# Patient Record
Sex: Female | Born: 1965 | Race: Black or African American | Hispanic: No | Marital: Single | Smoking: Current every day smoker
Health system: Southern US, Community
[De-identification: ages and names within clinical notes are randomized; demographics above are authoritative.]

## PROBLEM LIST (undated history)

## (undated) DIAGNOSIS — E119 Type 2 diabetes mellitus without complications: Secondary | ICD-10-CM

## (undated) DIAGNOSIS — I10 Essential (primary) hypertension: Secondary | ICD-10-CM

## (undated) HISTORY — DX: Type 2 diabetes mellitus without complications: E11.9

---

## 2017-10-25 ENCOUNTER — Other Ambulatory Visit: Payer: Self-pay | Admitting: Obstetrics and Gynecology

## 2017-10-25 ENCOUNTER — Other Ambulatory Visit (HOSPITAL_COMMUNITY)
Admission: RE | Admit: 2017-10-25 | Discharge: 2017-10-25 | Disposition: A | Discharge status: Home / Self Care | Payer: BLUE CROSS/BLUE SHIELD | Source: Ambulatory Visit | Attending: Obstetrics and Gynecology | Admitting: Obstetrics and Gynecology

## 2017-10-25 DIAGNOSIS — Z01411 Encounter for gynecological examination (general) (routine) with abnormal findings: Secondary | ICD-10-CM | POA: Diagnosis not present

## 2017-10-27 LAB — CYTOLOGY - PAP
Diagnosis: NEGATIVE
HPV (WINDOPATH): NOT DETECTED

## 2017-11-08 ENCOUNTER — Other Ambulatory Visit: Payer: Self-pay | Admitting: Internal Medicine

## 2017-11-08 DIAGNOSIS — Z1231 Encounter for screening mammogram for malignant neoplasm of breast: Secondary | ICD-10-CM

## 2017-11-27 ENCOUNTER — Encounter: Payer: Self-pay | Admitting: Radiology

## 2017-11-27 ENCOUNTER — Ambulatory Visit
Admission: RE | Admit: 2017-11-27 | Discharge: 2017-11-27 | Disposition: A | Discharge status: Home / Self Care | Payer: BLUE CROSS/BLUE SHIELD | Source: Ambulatory Visit | Attending: Internal Medicine | Admitting: Internal Medicine

## 2017-11-27 DIAGNOSIS — Z1231 Encounter for screening mammogram for malignant neoplasm of breast: Secondary | ICD-10-CM

## 2017-12-04 ENCOUNTER — Encounter: Payer: Self-pay | Admitting: Skilled Nursing Facility1

## 2017-12-04 ENCOUNTER — Encounter: Payer: BLUE CROSS/BLUE SHIELD | Attending: Internal Medicine | Admitting: Skilled Nursing Facility1

## 2017-12-04 DIAGNOSIS — Z713 Dietary counseling and surveillance: Secondary | ICD-10-CM | POA: Diagnosis present

## 2017-12-04 DIAGNOSIS — E119 Type 2 diabetes mellitus without complications: Secondary | ICD-10-CM

## 2017-12-04 NOTE — Patient Instructions (Addendum)
-  Always bring your meter with you everywhere you go -Always Properly dispose of your needles:  -Discard in a hard plastic/metal container with a lid (something the needle can't puncture)  -Write Do Not Recycle on the outside of the container  -Example: A laundry detergent bottle -Never use the same needle more than once -Eat 2-3 carbohydrate choices for each meal and 1 for each snack -A meal: carbohydrates, protein, vegetable -A snack: A Fruit OR Vegetable AND Protein  -Try to be more active -Always pay attention to your body keeping watchful of possible low blood sugar (below 70) or high blood sugar (above 200)  -Check your feet every day looking for anything that was not there the day before  -Log your numbers when checking your blood sugar  -Soy crumbles

## 2017-12-04 NOTE — Progress Notes (Signed)
Diabetes Self-Management Education  Visit Type: First/Initial  12/04/2017  [name], identified by name and date of birth, is a 52 y.o. female with a diagnosis of Diabetes: Type 2.   ASSESSMENT  There were no vitals taken for this visit. There is no height or weight on file to calculate BMI.  Pt did not know the name of her medications and states she is now taking them again after running out.  Pt is taking glipizide and metformin but pt states she is taking some kind of medicine that has metformin and something else in it.  Pt states she is checking her blood sugar randomly.  Pt states her blood sugar will drop low to 100 or 70 and she feels sweaty.  Pt states she wakes up in the middle of the night to snack usually when getting up to urinate. Pt states her hands have been numb for the last 2 weeks. Pt continued to shake her hands during the apportionment.  Pt states she plans to ride her bike for 40 minutes 6-7 days a week.   Diabetes Self-Management Education - 12/04/17 1515      Visit Information   Visit Type  First/Initial      Initial Visit   Diabetes Type  Type 2    Are you currently following a meal plan?  No    Are you taking your medications as prescribed?  Yes      Health Coping   How would you rate your overall health?  Fair      Psychosocial Assessment   Patient Belief/Attitude about Diabetes  Motivated to manage diabetes      Pre-Education Assessment   Patient understands the diabetes disease and treatment process.  Needs Instruction    Patient understands incorporating nutritional management into lifestyle.  Needs Instruction    Patient undertands incorporating physical activity into lifestyle.  Needs Instruction    Patient understands using medications safely.  Needs Instruction    Patient understands monitoring blood glucose, interpreting and using results  Needs Instruction    Patient understands prevention, detection, and treatment of acute  complications.  Needs Instruction    Patient understands prevention, detection, and treatment of chronic complications.  Needs Instruction    Patient understands how to develop strategies to address psychosocial issues.  Needs Instruction    Patient understands how to develop strategies to promote health/change behavior.  Needs Instruction      Complications   Last HgB A1C per patient/outside source  10 %    How often do you check your blood sugar?  1-2 times/day    Fasting Blood glucose range (mg/dL)  >130    Postprandial Blood glucose range (mg/dL)  >865    Have you had a dilated eye exam in the past 12 months?  No    Have you had a dental exam in the past 12 months?  No    Are you checking your feet?  Yes    How many days per week are you checking your feet?  7      Dietary Intake   Breakfast  skipped    Snack (morning)  desserts    Lunch  chicken and rice and snap peas    Snack (afternoon)  candy    Dinner  chicken and rice and snap peas    Snack (evening)  waking in the night to eat desserts    Beverage(s)  soda, juice, water, energy drinks, coffee, spirits  Exercise   Exercise Type  Light (walking / raking leaves)    How many days per week to you exercise?  1    How many minutes per day do you exercise?  40    Total minutes per week of exercise  40      Patient Education   Previous Diabetes Education  No    Disease state   Definition of diabetes, type 1 and 2, and the diagnosis of diabetes;Factors that contribute to the development of diabetes    Nutrition management   Food label reading, portion sizes and measuring food.;Effects of alcohol on blood glucose and safety factors with consumption of alcohol.    Physical activity and exercise   Role of exercise on diabetes management, blood pressure control and cardiac health.;Identified with patient nutritional and/or medication changes necessary with exercise.    Monitoring  Purpose and frequency of SMBG.;Yearly dilated eye  exam;Daily foot exams;Taught/evaluated SMBG meter.    Acute complications  Taught treatment of hypoglycemia - the 15 rule.;Discussed and identified patients' treatment of hyperglycemia.    Chronic complications  Assessed and discussed foot care and prevention of foot problems;Nephropathy, what it is, prevention of, the use of ACE, ARB's and early detection of through urine microalbumia.    Psychosocial adjustment  Role of stress on diabetes      Individualized Goals (developed by patient)   Nutrition  Follow meal plan discussed;Adjust meds/carbs with exercise as discussed;General guidelines for healthy choices and portions discussed    Physical Activity  Exercise 5-7 days per week;45 minutes per day      Post-Education Assessment   Patient understands the diabetes disease and treatment process.  Demonstrates understanding / competency    Patient understands incorporating nutritional management into lifestyle.  Demonstrates understanding / competency    Patient undertands incorporating physical activity into lifestyle.  Demonstrates understanding / competency    Patient understands using medications safely.  Demonstrates understanding / competency    Patient understands monitoring blood glucose, interpreting and using results  Demonstrates understanding / competency    Patient understands prevention, detection, and treatment of acute complications.  Demonstrates understanding / competency    Patient understands prevention, detection, and treatment of chronic complications.  Demonstrates understanding / competency    Patient understands how to develop strategies to address psychosocial issues.  Demonstrates understanding / competency    Patient understands how to develop strategies to promote health/change behavior.  Demonstrates understanding / competency      Outcomes   Expected Outcomes  Demonstrated interest in learning. Expect positive outcomes    Future DMSE  PRN    Program Status   Completed       Individualized Plan for Diabetes Self-Management Training:   Learning Objective:  Patient will have a greater understanding of diabetes self-management. Patient education plan is to attend individual and/or group sessions per assessed needs and concerns.   Plan:   Patient Instructions  -Always bring your meter with you everywhere you go -Always Properly dispose of your needles:  -Discard in a hard plastic/metal container with a lid (something the needle can't puncture)  -Write Do Not Recycle on the outside of the container  -Example: A laundry detergent bottle -Never use the same needle more than once -Eat 2-3 carbohydrate choices for each meal and 1 for each snack -A meal: carbohydrates, protein, vegetable -A snack: A Fruit OR Vegetable AND Protein  -Try to be more active -Always pay attention to your body keeping watchful of  possible low blood sugar (below 70) or high blood sugar (above 200)  -Check your feet every day looking for anything that was not there the day before  -Log your numbers when checking your blood sugar  -Soy crumbles   Expected Outcomes:  Demonstrated interest in learning. Expect positive outcomes  Education material provided: ADA Diabetes: Your Take Control Guide, My Plate and Snack sheet  If problems or questions, patient to contact team via:  Phone  Future DSME appointment: PRN

## 2018-07-03 ENCOUNTER — Emergency Department (HOSPITAL_COMMUNITY): Payer: Self-pay

## 2018-07-03 ENCOUNTER — Emergency Department (HOSPITAL_COMMUNITY)
Admission: EM | Admit: 2018-07-03 | Discharge: 2018-07-03 | Disposition: A | Discharge status: Home / Self Care | Payer: Self-pay | Attending: Emergency Medicine | Admitting: Emergency Medicine

## 2018-07-03 ENCOUNTER — Encounter (HOSPITAL_COMMUNITY): Payer: Self-pay

## 2018-07-03 ENCOUNTER — Other Ambulatory Visit: Payer: Self-pay

## 2018-07-03 DIAGNOSIS — Z5321 Procedure and treatment not carried out due to patient leaving prior to being seen by health care provider: Secondary | ICD-10-CM | POA: Insufficient documentation

## 2018-07-03 DIAGNOSIS — R200 Anesthesia of skin: Secondary | ICD-10-CM | POA: Insufficient documentation

## 2018-07-03 LAB — COMPREHENSIVE METABOLIC PANEL
ALBUMIN: 4.1 g/dL (ref 3.5–5.0)
ALT: 19 U/L (ref 0–44)
AST: 21 U/L (ref 15–41)
Alkaline Phosphatase: 78 U/L (ref 38–126)
Anion gap: 13 (ref 5–15)
BUN: 10 mg/dL (ref 6–20)
CO2: 24 mmol/L (ref 22–32)
Calcium: 9.9 mg/dL (ref 8.9–10.3)
Chloride: 99 mmol/L (ref 98–111)
Creatinine, Ser: 0.58 mg/dL (ref 0.44–1.00)
GFR calc Af Amer: >60 mL/min (ref >60)
GFR calc non Af Amer: >60 mL/min (ref >60)
Glucose, Bld: 355 mg/dL — ABNORMAL HIGH (ref 70–99)
Potassium: 3.6 mmol/L (ref 3.5–5.1)
Sodium: 136 mmol/L (ref 135–145)
Total Bilirubin: 1 mg/dL (ref 0.3–1.2)
Total Protein: 7.4 g/dL (ref 6.5–8.1)

## 2018-07-03 LAB — CBC
HCT: 40.4 % (ref 36.0–46.0)
Hemoglobin: 13.1 g/dL (ref 12.0–15.0)
MCH: 28.7 pg (ref 26.0–34.0)
MCHC: 32.4 g/dL (ref 30.0–36.0)
MCV: 88.6 fL (ref 80.0–100.0)
Platelets: 270 10*3/uL (ref 150–400)
RBC: 4.56 MIL/uL (ref 3.87–5.11)
RDW: 12.4 % (ref 11.5–15.5)
WBC: 5.2 10*3/uL (ref 4.0–10.5)
nRBC: 0 % (ref 0.0–0.2)

## 2018-07-03 LAB — APTT: aPTT: 29 seconds (ref 24–36)

## 2018-07-03 LAB — I-STAT TROPONIN, ED: Troponin i, poc: 0.01 ng/mL (ref 0.00–0.08)

## 2018-07-03 LAB — CBG MONITORING, ED: Glucose-Capillary: 326 mg/dL — ABNORMAL HIGH (ref 70–99)

## 2018-07-03 LAB — DIFFERENTIAL
Abs Immature Granulocytes: 0.01 10*3/uL (ref 0.00–0.07)
Basophils Absolute: 0 10*3/uL (ref 0.0–0.1)
Basophils Relative: 1 %
Eosinophils Absolute: 0.1 10*3/uL (ref 0.0–0.5)
Eosinophils Relative: 2 %
IMMATURE GRANULOCYTES: 0 %
LYMPHS ABS: 1.8 10*3/uL (ref 0.7–4.0)
Lymphocytes Relative: 35 %
Monocytes Absolute: 0.3 10*3/uL (ref 0.1–1.0)
Monocytes Relative: 6 %
Neutro Abs: 2.9 10*3/uL (ref 1.7–7.7)
Neutrophils Relative %: 56 %

## 2018-07-03 LAB — I-STAT BETA HCG BLOOD, ED (MC, WL, AP ONLY): I-stat hCG, quantitative: <5 m[IU]/mL (ref <5)

## 2018-07-03 LAB — PROTIME-INR
INR: 1.16
Prothrombin Time: 14.7 seconds (ref 11.4–15.2)

## 2018-07-03 NOTE — ED Notes (Signed)
Ambulated with steady gait. NO assistance was needed.

## 2018-07-03 NOTE — ED Notes (Signed)
Patient left [facility] ED AMA, stating she does not have to wait, she is getting evicted from home. Given information about [employer] and Goodrx card. Patient was pleasant in demeanor but strongly assured that she could not stay.

## 2018-07-03 NOTE — ED Triage Notes (Signed)
Pt here with complaints of being off balance and numbness to bilateral hands.  Symptoms for over a week.  Hx of HTN with non compliance with medication.  Remember to take medications today but has not for over a week.  States she trips over her feet and also developing abdominal pain while sitting in triage.

## 2018-07-03 NOTE — ED Triage Notes (Addendum)
On exam left hand is weaker than right with left foot feeling heavy for patient. Family member states patient's speech was slurred earlier last week, speech clear in triage.

## 2018-07-13 ENCOUNTER — Encounter: Payer: Self-pay | Admitting: Neurology

## 2018-07-19 ENCOUNTER — Ambulatory Visit: Payer: Self-pay | Attending: Family Medicine | Admitting: Physician Assistant

## 2018-07-19 ENCOUNTER — Telehealth: Payer: Self-pay | Admitting: Family Medicine

## 2018-07-19 VITALS — BP 175/90 | HR 75 | Temp 98.7°F | Ht 60.0 in | Wt 165.6 lb

## 2018-07-19 DIAGNOSIS — E1165 Type 2 diabetes mellitus with hyperglycemia: Secondary | ICD-10-CM

## 2018-07-19 DIAGNOSIS — Z09 Encounter for follow-up examination after completed treatment for conditions other than malignant neoplasm: Secondary | ICD-10-CM

## 2018-07-19 DIAGNOSIS — I10 Essential (primary) hypertension: Secondary | ICD-10-CM

## 2018-07-19 LAB — POCT GLYCOSYLATED HEMOGLOBIN (HGB A1C): Hemoglobin A1C: 11.5 % — AB (ref 4.0–5.6)

## 2018-07-19 LAB — GLUCOSE, POCT (MANUAL RESULT ENTRY): POC GLUCOSE: 269 mg/dL — AB (ref 70–99)

## 2018-07-19 MED ORDER — LISINOPRIL-HYDROCHLOROTHIAZIDE 10-12.5 MG PO TABS: 2.0000 | ORAL_TABLET | Freq: Every day | ORAL | 3 refills | Status: DC

## 2018-07-19 MED ORDER — RELION LANCING DEVICE KIT: 500.0000 mg | PACK | Freq: Every day | 0 refills | Status: DC

## 2018-07-19 MED ORDER — METFORMIN HCL 500 MG PO TABS: 1000.0000 mg | ORAL_TABLET | Freq: Two times a day (BID) | ORAL | 3 refills | Status: DC

## 2018-07-19 MED ORDER — TRUE METRIX METER DEVI: 1.0000 | Freq: Three times a day (TID) | 0 refills | Status: DC

## 2018-07-19 MED ORDER — GLUCOSE BLOOD VI STRP: ORAL_STRIP | 12 refills | Status: DC

## 2018-07-19 MED ORDER — RELION PRIME MONITOR DEVI: 0 refills | Status: DC

## 2018-07-19 MED ORDER — GLIPIZIDE 10 MG PO TABS: 10.0000 mg | ORAL_TABLET | Freq: Every day | ORAL | 3 refills | Status: DC

## 2018-07-19 MED ORDER — GLUCOSE BLOOD VI STRP: 1.0000 | ORAL_STRIP | Freq: Three times a day (TID) | 12 refills | Status: DC

## 2018-07-19 MED ORDER — TRUEPLUS LANCETS 28G MISC: 1.0000 | Freq: Three times a day (TID) | 11 refills | Status: DC

## 2018-07-19 MED FILL — metFORMIN HCL 500 MG TABS: 500 | 30 days supply | Qty: 120 | Fill #0

## 2018-07-19 MED FILL — !TRUE METRIX BLOOD GLUCOSE: 365 days supply | Qty: 1 | Fill #0

## 2018-07-19 MED FILL — TRUEplus LANCETS 28G MISC: 30 days supply | Qty: 100 | Fill #0

## 2018-07-19 MED FILL — LISINOPRIL-HCTZ 10-12.5 MG: 10-12.5 | 30 days supply | Qty: 60 | Fill #0

## 2018-07-19 MED FILL — glipiZIDE 10 MG TABS: 10 | 30 days supply | Qty: 30 | Fill #0

## 2018-07-19 MED FILL — TRUE METRIX TEST STRIP: 30 days supply | Qty: 100 | Fill #0

## 2018-07-19 NOTE — Patient Instructions (Signed)
Check blood sugars fasting and at bedtime and record and bring to next visit.  Check blood pressure 3 times weekly and record and bring to next visit

## 2018-07-19 NOTE — Telephone Encounter (Signed)
[facility] [address] ([address]), [place] - [address] [phone] (Phone)    Pharmacy called to inquire if [facility] wanted to change pt to plain Metformin as written. Pharmacist states pt typically get the extended release.

## 2018-07-19 NOTE — Progress Notes (Signed)
Patient ID: Cassandra Gordon, female   DOB: June 04, 1966, 53 y.o.   MRN: [id number]      Cassandra Gordon, is a 53 y.o. female  [id number]:[id number]  [id number]  DOB - June 19, 1966  Subjective:  Chief Complaint and HPI: Cassandra Gordon is a 53 y.o. female here today to establish care and for a follow up visit. Went to ED 07/03/2018 and left without being seen but not before labs could be done and blood sugar was 326.  Other labs negative.  She has been diabetic for about 30 years by history.  She says she has been out of meds for about 7 months.  She has not been checkin her blood sugars.  The hospital did call in some medications to get her restarted on them.  She is c/o paresthesias in B feet/hands.  Needs new meter.  Not checking sugars.    ED/Hospital notes reviewed-summary above.    ROS:   Constitutional:  No f/c, No night sweats, No unexplained weight loss. EENT:  No vision changes, No blurry vision, No hearing changes. No mouth, throat, or ear problems.  Respiratory: No cough, No SOB Cardiac: No CP, no palpitations GI:  No abd pain, No N/V/D. GU: No Urinary s/sx Musculoskeletal: No joint pain Neuro: No headache, no dizziness, no motor weakness.  Skin: No rash Endocrine:  some polydipsia. some polyuria.  Psych: Denies SI/HI  No problems updated.  ALLERGIES: No Known Allergies  PAST MEDICAL HISTORY: Past Medical History:  Diagnosis Date  . Diabetes mellitus without complication ([place])     MEDICATIONS AT HOME: Prior to Admission medications   Medication Sig Start Date End Date Taking? Authorizing Provider  glipiZIDE (GLUCOTROL) 10 MG tablet Take 1 tablet (10 mg total) by mouth daily before breakfast. 07/19/18  Yes McClung, Angela M, PA-C  lisinopril-hydrochlorothiazide (PRINZIDE,ZESTORETIC) 10-12.5 MG tablet Take 2 tablets by mouth daily. 07/19/18  Yes McClung, Angela M, PA-C  metFORMIN (GLUCOPHAGE) 500 MG tablet Take 2 tablets (1,000 mg total) by mouth 2 (two) times daily with a  meal. Take 1 tablet by mouth twice daily with a meal for 30 days. 07/19/18  Yes Freeman Caldron M, PA-C  Blood Glucose Monitoring Suppl (RELION PRIME MONITOR) DEVI [place] as directed 07/19/18   Argentina Donovan, PA-C  Blood Glucose Monitoring Suppl (TRUE METRIX METER) DEVI 1 each by Does not apply route 3 (three) times daily. 07/19/18   Argentina Donovan, PA-C  glucose blood (RELION PRIME TEST) test strip Use as instructed 07/19/18   Argentina Donovan, PA-C  glucose blood (TRUE METRIX BLOOD GLUCOSE TEST) test strip 1 each by Other route 3 (three) times daily. 07/19/18   Argentina Donovan, PA-C  Lancets Misc. (RELION LANCING DEVICE) KIT 500 mg by Does not apply route daily. 07/19/18   Argentina Donovan, PA-C  TRUEPLUS LANCETS 28G MISC 1 each by Does not apply route 3 (three) times daily. 07/19/18   Argentina Donovan, PA-C     Objective:  EXAM:   Vitals:   07/19/18 1128  BP: (!) 175/90  Pulse: 75  Temp: 98.7 F (37.1 C)  TempSrc: Oral  SpO2: 98%  Weight: 165 lb 9.6 oz (75.1 kg)  Height: 5' (1.524 m)    General appearance : A&OX3. NAD. Non-toxic-appearing HEENT: Atraumatic and Normocephalic.  PERRLA. EOM intact.   Chest/Lungs:  Breathing-non-labored, Good air entry bilaterally, breath sounds normal without rales, rhonchi, or wheezing  CVS: S1 S2 regular, no murmurs, gallops, rubs  Extremities: Bilateral Lower Ext  shows no edema, both legs are warm to touch with = pulse throughout Neurology:  CN II-XII grossly intact, Non focal.   Psych:  TP linear in general but sometimes scattered. J/I fair. Normal speech. Appropriate eye contact and affect.  Skin:  No Rash  Data Review Lab Results  Component Value Date   HGBA1C 11.5 (A) 07/19/2018     Assessment & Plan   1. Type 2 diabetes mellitus with hyperglycemia, unspecified whether long term insulin use (HCC) Uncontrolled-will go ahead and increase dose of metfromin from '500mg'$  bid to '1000mg'$  bid - Glucose (CBG) - HgB A1c - metFORMIN  (GLUCOPHAGE) 500 MG tablet; Take 2 tablets (1,000 mg total) by mouth 2 (two) times daily with a meal. Take 1 tablet by mouth twice daily with a meal for 30 days.  Dispense: 120 tablet; Refill: 3 - glipiZIDE (GLUCOTROL) 10 MG tablet; Take 1 tablet (10 mg total) by mouth daily before breakfast.  Dispense: 30 tablet; Refill: 3 -new meter and supplies ordered.test glucose fasting and bedtime and bring to next visit.  2. Hypertension, unspecified type Uncontrolled-increase dose to - lisinopril-hydrochlorothiazide (PRINZIDE,ZESTORETIC) 10-12.5 MG tablet; Take 2 tablets by mouth daily.  Dispense: 30 tablet; Refill: 3 -check bp 3-5 times weekly and record and bring to next visit.  Importance of compliance stressed.  3. Encounter for examination following treatment at hospital Improving-not at any goals  Patient have been counseled extensively about nutrition and exercise  Return in about 3 weeks (around 08/09/2018) for assign PCP-f/up DM.  The patient was given clear instructions to go to ER or return to medical center if symptoms don't improve, worsen or new problems develop. The patient verbalized understanding. The patient was told to call to get lab results if they haven't heard anything in the next week.     Freeman Caldron, PA-C [facility], [place]   07/19/2018, 1:25 PM

## 2018-07-26 ENCOUNTER — Ambulatory Visit: Payer: Self-pay | Admitting: Family Medicine

## 2018-08-06 ENCOUNTER — Other Ambulatory Visit: Payer: Self-pay | Admitting: Pharmacist

## 2018-08-06 DIAGNOSIS — E1165 Type 2 diabetes mellitus with hyperglycemia: Secondary | ICD-10-CM

## 2018-08-06 MED ORDER — METFORMIN HCL 500 MG PO TABS: 1000.0000 mg | ORAL_TABLET | Freq: Two times a day (BID) | ORAL | 2 refills | Status: DC

## 2018-09-07 ENCOUNTER — Other Ambulatory Visit: Payer: Self-pay

## 2018-09-07 ENCOUNTER — Encounter (HOSPITAL_COMMUNITY): Payer: Self-pay

## 2018-09-07 ENCOUNTER — Emergency Department (HOSPITAL_COMMUNITY)
Admission: EM | Admit: 2018-09-07 | Discharge: 2018-09-07 | Disposition: A | Discharge status: Home / Self Care | Payer: Self-pay | Attending: Emergency Medicine | Admitting: Emergency Medicine

## 2018-09-07 DIAGNOSIS — E119 Type 2 diabetes mellitus without complications: Secondary | ICD-10-CM | POA: Insufficient documentation

## 2018-09-07 DIAGNOSIS — Z79899 Other long term (current) drug therapy: Secondary | ICD-10-CM | POA: Insufficient documentation

## 2018-09-07 DIAGNOSIS — I10 Essential (primary) hypertension: Secondary | ICD-10-CM | POA: Insufficient documentation

## 2018-09-07 DIAGNOSIS — F17210 Nicotine dependence, cigarettes, uncomplicated: Secondary | ICD-10-CM | POA: Insufficient documentation

## 2018-09-07 DIAGNOSIS — H1089 Other conjunctivitis: Secondary | ICD-10-CM | POA: Insufficient documentation

## 2018-09-07 DIAGNOSIS — H109 Unspecified conjunctivitis: Secondary | ICD-10-CM

## 2018-09-07 DIAGNOSIS — J302 Other seasonal allergic rhinitis: Secondary | ICD-10-CM

## 2018-09-07 HISTORY — DX: Essential (primary) hypertension: I10

## 2018-09-07 MED ORDER — LORATADINE 10 MG PO TABS: 10.0000 mg | ORAL_TABLET | Freq: Every day | ORAL | 0 refills | Status: DC

## 2018-09-07 MED ORDER — TETRACAINE HCL 0.5 % OP SOLN
2.0000 [drp] | Freq: Once | OPHTHALMIC | Status: AC
  Administered 2018-09-07: 2 [drp] via OPHTHALMIC
  Filled 2018-09-07: qty 4

## 2018-09-07 MED ORDER — FLUORESCEIN SODIUM 1 MG OP STRP
1.0000 | ORAL_STRIP | Freq: Once | OPHTHALMIC | Status: AC
  Administered 2018-09-07: 1 via OPHTHALMIC
  Filled 2018-09-07: qty 1

## 2018-09-07 MED ORDER — FLUTICASONE PROPIONATE 50 MCG/ACT NA SUSP: 2.0000 | Freq: Every day | NASAL | 0 refills | Status: DC

## 2018-09-07 MED ORDER — ERYTHROMYCIN 5 MG/GM OP OINT
1.0000 "application " | TOPICAL_OINTMENT | Freq: Four times a day (QID) | OPHTHALMIC | Status: DC
  Administered 2018-09-07: 1 via OPHTHALMIC
  Filled 2018-09-07: qty 3.5

## 2018-09-07 NOTE — ED Provider Notes (Signed)
[facility] EMERGENCY DEPARTMENT Provider Note   CSN: [id number] Arrival date & time: 09/07/18  0911    History   Chief Complaint Chief Complaint  Patient presents with  . Eye Problem  . Cough    HPI Cassandra Gordon is a 53 y.o. female.     HPI  Pt is a 53 y/o female with a h/o T2DM, HTN, who presents to the ED today for evaluation of bilat eye redness that began 4 days ago. She reports associated, bilat eye irritation, itchiness, and drainage. States initially she only had drainage of pus in the morning, however she has now had recurrent drainage of pus throughout the day. Does not wear contacts. She has some blurred vision when pus is present however when she clears pus away her vision improves. Reports associated rhinorrhea, congestion, post nasal drip, sneezing, scratchy/sore throat,   She also reports cough, and she attributes to her postnasal drip. No cp or sob. No fevers.  States that her partner also had a cold that has resolved. No recent foreign travel or travel to areas with high rates of coronavirus. No known exposures to persons with coronavirus.  Past Medical History:  Diagnosis Date  . Diabetes mellitus without complication ([place])   . Hypertension     Patient Active Problem List   Diagnosis Date Noted  . Type 2 diabetes mellitus with hyperglycemia ([place]) 07/19/2018  . Hypertension 07/19/2018    History reviewed. No pertinent surgical history.   OB History   No obstetric history on file.      Home Medications    Prior to Admission medications   Medication Sig Start Date End Date Taking? Authorizing Provider  Blood Glucose Monitoring Suppl (RELION PRIME MONITOR) DEVI [place] as directed 07/19/18   Argentina Donovan, PA-C  Blood Glucose Monitoring Suppl (TRUE METRIX METER) DEVI 1 each by Does not apply route 3 (three) times daily. 07/19/18   Argentina Donovan, PA-C  fluticasone (FLONASE) 50 MCG/ACT nasal spray Place 2 sprays into both  nostrils daily. 09/07/18   Anouk Critzer S, PA-C  glipiZIDE (GLUCOTROL) 10 MG tablet Take 1 tablet (10 mg total) by mouth daily before breakfast. 07/19/18   Argentina Donovan, PA-C  glucose blood (RELION PRIME TEST) test strip Use as instructed 07/19/18   Argentina Donovan, PA-C  glucose blood (TRUE METRIX BLOOD GLUCOSE TEST) test strip 1 each by Other route 3 (three) times daily. 07/19/18   Argentina Donovan, PA-C  Lancets Misc. (RELION LANCING DEVICE) KIT 500 mg by Does not apply route daily. 07/19/18   Argentina Donovan, PA-C  lisinopril-hydrochlorothiazide (PRINZIDE,ZESTORETIC) 10-12.5 MG tablet Take 2 tablets by mouth daily. 07/19/18   Argentina Donovan, PA-C  loratadine (CLARITIN) 10 MG tablet Take 1 tablet (10 mg total) by mouth daily for 14 days. 09/07/18 09/21/18  Joory Gough S, PA-C  metFORMIN (GLUCOPHAGE) 500 MG tablet Take 2 tablets (1,000 mg total) by mouth 2 (two) times daily with a meal. 08/06/18   Charlott Rakes, MD  TRUEPLUS LANCETS 28G MISC 1 each by Does not apply route 3 (three) times daily. 07/19/18   Argentina Donovan, PA-C    Family History Family History  Problem Relation Age of Onset  . Cancer Other   . Asthma Other     Social History Social History   Tobacco Use  . Smoking status: Current Every Day Smoker  . Smokeless tobacco: Never Used  Substance Use Topics  . Alcohol use: Yes  .  Drug use: Never     Allergies   Patient has no known allergies.   Review of Systems Review of Systems  Constitutional: Negative for chills and fever.  HENT: Positive for congestion, postnasal drip, rhinorrhea and sore throat. Negative for ear pain.   Eyes: Positive for discharge and itching. Negative for pain and visual disturbance.  Respiratory: Positive for cough. Negative for shortness of breath and wheezing.   Cardiovascular: Negative for chest pain.  Gastrointestinal: Negative for abdominal pain and vomiting.  Genitourinary: Negative for dysuria and hematuria.   Musculoskeletal: Negative for back pain.  Skin: Negative for rash.  Neurological: Negative for headaches.  All other systems reviewed and are negative.    Physical Exam Updated Vital Signs BP (!) 169/87   Pulse 93   Temp 98.9 F (37.2 C) (Oral)   Resp 16   SpO2 100%   Physical Exam Vitals signs and nursing note reviewed.  Constitutional:      General: She is not in acute distress.    Appearance: She is well-developed.  HENT:     Head: Normocephalic and atraumatic.     Nose: Congestion present.     Mouth/Throat:     Mouth: Mucous membranes are moist.     Pharynx: No oropharyngeal exudate or posterior oropharyngeal erythema.     Comments: No tonsillar edema or exudates. Does have several tonsilloliths.  Eyes:     General:        Right eye: Discharge present.        Left eye: Discharge present.    Pupils: Pupils are equal, round, and reactive to light.     Comments: Conjunctiva are erythematous bilat. No uptake with fluorescein staining. Tonometry: R: 20, L 21.  Neck:     Musculoskeletal: Neck supple.  Cardiovascular:     Rate and Rhythm: Normal rate and regular rhythm.     Heart sounds: Normal heart sounds. No murmur.  Pulmonary:     Effort: Pulmonary effort is normal. No respiratory distress.     Breath sounds: Normal breath sounds. No stridor. No wheezing or rhonchi.  Abdominal:     Palpations: Abdomen is soft.     Tenderness: There is no abdominal tenderness.  Skin:    General: Skin is warm and dry.  Neurological:     Mental Status: She is alert.      ED Treatments / Results  Labs (all labs ordered are listed, but only abnormal results are displayed) Labs Reviewed - No data to display  EKG None  Radiology No results found.  Procedures Procedures (including critical care time)  Medications Ordered in ED Medications  erythromycin ophthalmic ointment 1 application (1 application Both Eyes Given 09/07/18 1003)  tetracaine (PONTOCAINE) 0.5 %  ophthalmic solution 2 drop (2 drops Both Eyes Given by Other 09/07/18 4503)  fluorescein ophthalmic strip 1 strip (1 strip Both Eyes Given 09/07/18 0938)     Initial Impression / Assessment and Plan / ED Course  I have reviewed the triage vital signs and the nursing notes.  Pertinent labs & imaging results that were available during my care of the patient were reviewed by me and considered in my medical decision making (see chart for details).    Final Clinical Impressions(s) / ED Diagnoses   Final diagnoses:  Bacterial conjunctivitis  Seasonal allergies    Patient presenting with rhinorrhea, congestion, sneezing, itchy throat, and eye irritation/drainage. Vital signs are stable and patient is afebrile. Nontoxic and nonseptic appearing.  Physical exam  was within normal limits.  HEENT exam is grossly normal without evidence of bacterial infection. Lungs CTA.  Doubt PNa,  influenza or covid 19 without fevers or significant lower respiratory sxs. Sxs consistent with seasonal allergies. Given persistent eye sxs and new development of purulent drainage to bilat eyes that recurs throughout the day, will give rx for erythromycin. No uptake with fluoroscein stain. Pressures grossly wnl. Does not wear contacts.   Will give symptomatic treatment with allergy medications.  Advised that if sxs resolve with claritin and other allergy medications she is likely safe to return to work. She should refrain from going to work if she has continued cough or develops fevers until 48 hours after sxs resolve. Advise follow-up with PCP in 1 week and discussed reasons to return immediately to the ED.  All questions answered and patient understands the plan and reasons to return.  ED Discharge Orders         Ordered    loratadine (CLARITIN) 10 MG tablet  Daily     09/07/18 0957    fluticasone (FLONASE) 50 MCG/ACT nasal spray  Daily     09/07/18 0957           Rodney Booze, PA-C 09/07/18 1006    Noemi Chapel, MD 09/08/18 [phone]

## 2018-09-07 NOTE — ED Triage Notes (Addendum)
Pt reports bilateral eye redness, itchiness and drainage for the past 2-3 days. Pt states when she wakes up her eyes are sealed shut from drainage. Blurred vision since the symptoms started, pt wears glasses. Pt also reports productive cough. Denies fevers at home. Denies recent travel or sick contacts

## 2018-09-07 NOTE — ED Notes (Signed)
ED Provider at bedside. 

## 2018-09-07 NOTE — Discharge Instructions (Addendum)
Apply the antibiotic ointment to your eyes 4 times daily for the next 7 days.   Please use claritin and flonase as directed.   If your symptoms completely resolve with these medications then you are likely safe to be around people. If your symptoms persist, if you develop fevers, or a worsening cough then you should quarantine yourself until you do not have symptoms for 48 hours without medication.   Please follow up with your primary care provider within 5-7 days for re-evaluation of your symptoms. If you do not have a primary care provider, information for a healthcare clinic has been provided for you to make arrangements for follow up care. Please return to the emergency department for any new or worsening symptoms.

## 2018-09-15 ENCOUNTER — Emergency Department (HOSPITAL_COMMUNITY)
Admission: EM | Admit: 2018-09-15 | Discharge: 2018-09-15 | Disposition: A | Discharge status: Home / Self Care | Payer: Self-pay | Attending: Emergency Medicine | Admitting: Emergency Medicine

## 2018-09-15 ENCOUNTER — Emergency Department (HOSPITAL_COMMUNITY): Payer: Self-pay

## 2018-09-15 ENCOUNTER — Other Ambulatory Visit: Payer: Self-pay

## 2018-09-15 DIAGNOSIS — Z79899 Other long term (current) drug therapy: Secondary | ICD-10-CM | POA: Insufficient documentation

## 2018-09-15 DIAGNOSIS — I10 Essential (primary) hypertension: Secondary | ICD-10-CM

## 2018-09-15 DIAGNOSIS — Z7984 Long term (current) use of oral hypoglycemic drugs: Secondary | ICD-10-CM | POA: Insufficient documentation

## 2018-09-15 DIAGNOSIS — H538 Other visual disturbances: Secondary | ICD-10-CM | POA: Insufficient documentation

## 2018-09-15 DIAGNOSIS — H5509 Other forms of nystagmus: Secondary | ICD-10-CM | POA: Insufficient documentation

## 2018-09-15 DIAGNOSIS — E1165 Type 2 diabetes mellitus with hyperglycemia: Secondary | ICD-10-CM | POA: Insufficient documentation

## 2018-09-15 DIAGNOSIS — R42 Dizziness and giddiness: Secondary | ICD-10-CM

## 2018-09-15 DIAGNOSIS — R739 Hyperglycemia, unspecified: Secondary | ICD-10-CM

## 2018-09-15 DIAGNOSIS — R51 Headache: Secondary | ICD-10-CM | POA: Insufficient documentation

## 2018-09-15 DIAGNOSIS — F17200 Nicotine dependence, unspecified, uncomplicated: Secondary | ICD-10-CM | POA: Insufficient documentation

## 2018-09-15 LAB — CBC
HCT: 37.9 % (ref 36.0–46.0)
Hemoglobin: 12.3 g/dL (ref 12.0–15.0)
MCH: 29.1 pg (ref 26.0–34.0)
MCHC: 32.5 g/dL (ref 30.0–36.0)
MCV: 89.8 fL (ref 80.0–100.0)
Platelets: 316 10*3/uL (ref 150–400)
RBC: 4.22 MIL/uL (ref 3.87–5.11)
RDW: 11.9 % (ref 11.5–15.5)
WBC: 5.9 10*3/uL (ref 4.0–10.5)
nRBC: 0 % (ref 0.0–0.2)

## 2018-09-15 LAB — URINALYSIS, ROUTINE W REFLEX MICROSCOPIC
Bilirubin Urine: NEGATIVE
Glucose, UA: >=500 mg/dL — AB
Hgb urine dipstick: NEGATIVE
Ketones, ur: NEGATIVE mg/dL
Leukocytes,Ua: NEGATIVE
NITRITE: NEGATIVE
Protein, ur: NEGATIVE mg/dL
Specific Gravity, Urine: 1.026 (ref 1.005–1.030)
pH: 5 (ref 5.0–8.0)

## 2018-09-15 LAB — PROTIME-INR
INR: 1.1 (ref 0.8–1.2)
Prothrombin Time: 14.4 seconds (ref 11.4–15.2)

## 2018-09-15 LAB — COMPREHENSIVE METABOLIC PANEL
ALK PHOS: 96 U/L (ref 38–126)
ALT: 20 U/L (ref 0–44)
AST: 21 U/L (ref 15–41)
Albumin: 3.9 g/dL (ref 3.5–5.0)
Anion gap: 11 (ref 5–15)
BILIRUBIN TOTAL: 0.7 mg/dL (ref 0.3–1.2)
BUN: 10 mg/dL (ref 6–20)
CO2: 22 mmol/L (ref 22–32)
CREATININE: 0.66 mg/dL (ref 0.44–1.00)
Calcium: 9.8 mg/dL (ref 8.9–10.3)
Chloride: 98 mmol/L (ref 98–111)
GFR calc Af Amer: >60 mL/min (ref >60)
GFR calc non Af Amer: >60 mL/min (ref >60)
Glucose, Bld: 269 mg/dL — ABNORMAL HIGH (ref 70–99)
Potassium: 4.1 mmol/L (ref 3.5–5.1)
Sodium: 131 mmol/L — ABNORMAL LOW (ref 135–145)
TOTAL PROTEIN: 7.8 g/dL (ref 6.5–8.1)

## 2018-09-15 LAB — TROPONIN I: Troponin I: <0.03 ng/mL (ref <0.03)

## 2018-09-15 LAB — I-STAT BETA HCG BLOOD, ED (MC, WL, AP ONLY): I-stat hCG, quantitative: <5 m[IU]/mL (ref <5)

## 2018-09-15 LAB — RAPID URINE DRUG SCREEN, HOSP PERFORMED
Amphetamines: NOT DETECTED
Barbiturates: NOT DETECTED
Benzodiazepines: NOT DETECTED
Cocaine: NOT DETECTED
Opiates: NOT DETECTED
Tetrahydrocannabinol: NOT DETECTED

## 2018-09-15 LAB — DIFFERENTIAL
Abs Immature Granulocytes: 0.03 10*3/uL (ref 0.00–0.07)
Basophils Absolute: 0 10*3/uL (ref 0.0–0.1)
Basophils Relative: 1 %
Eosinophils Absolute: 0.3 10*3/uL (ref 0.0–0.5)
Eosinophils Relative: 4 %
IMMATURE GRANULOCYTES: 1 %
Lymphocytes Relative: 23 %
Lymphs Abs: 1.4 10*3/uL (ref 0.7–4.0)
Monocytes Absolute: 0.3 10*3/uL (ref 0.1–1.0)
Monocytes Relative: 5 %
Neutro Abs: 3.9 10*3/uL (ref 1.7–7.7)
Neutrophils Relative %: 66 %

## 2018-09-15 LAB — CBG MONITORING, ED: Glucose-Capillary: 257 mg/dL — ABNORMAL HIGH (ref 70–99)

## 2018-09-15 LAB — APTT: aPTT: 31 seconds (ref 24–36)

## 2018-09-15 MED ORDER — DIAZEPAM 5 MG PO TABS
5.0000 mg | ORAL_TABLET | Freq: Once | ORAL | Status: AC
  Administered 2018-09-15: 5 mg via ORAL
  Filled 2018-09-15: qty 1

## 2018-09-15 MED ORDER — LISINOPRIL-HYDROCHLOROTHIAZIDE 20-25 MG PO TABS: 2.0000 | ORAL_TABLET | Freq: Every day | ORAL | 1 refills | Status: DC

## 2018-09-15 MED ORDER — DIAZEPAM 5 MG PO TABS: 5.0000 mg | ORAL_TABLET | Freq: Two times a day (BID) | ORAL | 0 refills | Status: DC | PRN

## 2018-09-15 NOTE — ED Provider Notes (Signed)
[facility] EMERGENCY DEPARTMENT Provider Note   CSN: [id number] Arrival date & time: 09/15/18  1108    History   Chief Complaint Chief Complaint  Patient presents with  . Dizziness    HPI Cassandra Gordon is a 53 y.o. female.     Patient with history of diabetes and hypertension presents the emergency department today with complaint of dizziness and balance problems.  Patient states that for the past 3 days or so she has had dizziness which she describes as a spinning sensation.  She is able to walk but feels very unsteady on her feet like she is going to fall.  Patient had similar symptoms in January and came to the emergency department but had to leave prior to full evaluation.  She has had some blurry vision but no loss of vision or significant double vision. She has an occasional headache.  No nausea or vomiting.  Dizziness is worse with movement and walking.  No chest pain or shortness of breath, abdominal pain.  No urinary symptoms.  No previous history of strokes.  Symptoms were so bad today that the patient called the ambulance.  No treatments prior to arrival.  CT 07/03/18: IMPRESSION: 1. Advanced and age indeterminate multifocal small vessel ischemia in the brain. Consider an acute small vessel infarct in this clinical setting. 2. No CT evidence of acute cortically based infarct. No acute intracranial hemorrhage. 3. Nonspecific perisylvian volume loss with mild ventriculomegaly.     Past Medical History:  Diagnosis Date  . Diabetes mellitus without complication ([place])   . Hypertension     Patient Active Problem List   Diagnosis Date Noted  . Type 2 diabetes mellitus with hyperglycemia ([place]) 07/19/2018  . Hypertension 07/19/2018    No past surgical history on file.   OB History   No obstetric history on file.      Home Medications    Prior to Admission medications   Medication Sig Start Date End Date Taking? Authorizing Provider   Blood Glucose Monitoring Suppl (RELION PRIME MONITOR) DEVI [place] as directed 07/19/18   Argentina Donovan, PA-C  Blood Glucose Monitoring Suppl (TRUE METRIX METER) DEVI 1 each by Does not apply route 3 (three) times daily. 07/19/18   Argentina Donovan, PA-C  fluticasone (FLONASE) 50 MCG/ACT nasal spray Place 2 sprays into both nostrils daily. 09/07/18   Couture, Cortni S, PA-C  glipiZIDE (GLUCOTROL) 10 MG tablet Take 1 tablet (10 mg total) by mouth daily before breakfast. 07/19/18   Argentina Donovan, PA-C  glucose blood (RELION PRIME TEST) test strip Use as instructed 07/19/18   Argentina Donovan, PA-C  glucose blood (TRUE METRIX BLOOD GLUCOSE TEST) test strip 1 each by Other route 3 (three) times daily. 07/19/18   Argentina Donovan, PA-C  Lancets Misc. (RELION LANCING DEVICE) KIT 500 mg by Does not apply route daily. 07/19/18   Argentina Donovan, PA-C  lisinopril-hydrochlorothiazide (PRINZIDE,ZESTORETIC) 10-12.5 MG tablet Take 2 tablets by mouth daily. 07/19/18   Argentina Donovan, PA-C  loratadine (CLARITIN) 10 MG tablet Take 1 tablet (10 mg total) by mouth daily for 14 days. 09/07/18 09/21/18  Couture, Cortni S, PA-C  metFORMIN (GLUCOPHAGE) 500 MG tablet Take 2 tablets (1,000 mg total) by mouth 2 (two) times daily with a meal. 08/06/18   Charlott Rakes, MD  TRUEPLUS LANCETS 28G MISC 1 each by Does not apply route 3 (three) times daily. 07/19/18   Argentina Donovan, PA-C  Family History Family History  Problem Relation Age of Onset  . Cancer Other   . Asthma Other     Social History Social History   Tobacco Use  . Smoking status: Current Every Day Smoker  . Smokeless tobacco: Never Used  Substance Use Topics  . Alcohol use: Yes  . Drug use: Never     Allergies   Patient has no known allergies.   Review of Systems Review of Systems  Constitutional: Negative for fever.  HENT: Negative for congestion, dental problem, rhinorrhea and sinus pressure.   Eyes: Positive for visual disturbance  (blurry vision). Negative for photophobia, discharge and redness.  Respiratory: Negative for shortness of breath.   Cardiovascular: Negative for chest pain.  Gastrointestinal: Negative for nausea and vomiting.  Musculoskeletal: Positive for gait problem. Negative for neck pain and neck stiffness.  Skin: Negative for rash.  Neurological: Positive for dizziness, weakness (generalized) and headaches. Negative for syncope, speech difficulty, light-headedness and numbness.  Psychiatric/Behavioral: Negative for confusion.     Physical Exam Updated Vital Signs BP 139/70 (BP Location: Right Arm)   Pulse 90   Temp 97.9 F (36.6 C) (Oral)   Resp 20   Ht 5' (1.524 m)   Wt 72.1 kg   SpO2 98%   BMI 31.05 kg/m   Physical Exam Vitals signs and nursing note reviewed.  Constitutional:      Appearance: She is well-developed.  HENT:     Head: Normocephalic and atraumatic.     Right Ear: Tympanic membrane, ear canal and external ear normal.     Left Ear: Tympanic membrane, ear canal and external ear normal.     Nose: Nose normal.     Mouth/Throat:     Pharynx: Uvula midline.  Eyes:     General: Lids are normal.     Extraocular Movements:     Right eye: Nystagmus present.     Left eye: Nystagmus present.     Conjunctiva/sclera: Conjunctivae normal.     Pupils: Pupils are equal, round, and reactive to light.     Comments: Horizontal nystagmus fast phase toward her right.   Neck:     Musculoskeletal: Normal range of motion and neck supple.  Cardiovascular:     Rate and Rhythm: Normal rate and regular rhythm.  Pulmonary:     Effort: Pulmonary effort is normal.     Breath sounds: Normal breath sounds.  Abdominal:     Palpations: Abdomen is soft.     Tenderness: There is no abdominal tenderness.  Musculoskeletal:     Cervical back: She exhibits normal range of motion, no tenderness and no bony tenderness.  Skin:    General: Skin is warm and dry.  Neurological:     Mental Status: She  is alert and oriented to person, place, and time.     GCS: GCS eye subscore is 4. GCS verbal subscore is 5. GCS motor subscore is 6.     Cranial Nerves: No cranial nerve deficit.     Sensory: No sensory deficit.     Coordination: Coordination normal.     Gait: Gait abnormal.     Deep Tendon Reflexes: Reflexes are normal and symmetric.     Comments: Pt walks without assistance but feels like she is unsteady, small steps and not fluid gait.       ED Treatments / Results  Labs (all labs ordered are listed, but only abnormal results are displayed) Labs Reviewed  COMPREHENSIVE METABOLIC PANEL - Abnormal;  Notable for the following components:      Result Value   Sodium 131 (*)    Glucose, Bld 269 (*)    All other components within normal limits  URINALYSIS, ROUTINE W REFLEX MICROSCOPIC - Abnormal; Notable for the following components:   Glucose, UA >=500 (*)    Bacteria, UA RARE (*)    All other components within normal limits  CBG MONITORING, ED - Abnormal; Notable for the following components:   Glucose-Capillary 257 (*)    All other components within normal limits  PROTIME-INR  APTT  CBC  DIFFERENTIAL  RAPID URINE DRUG SCREEN, HOSP PERFORMED  TROPONIN I  I-STAT BETA HCG BLOOD, ED ([facility], [facility], [facility] ONLY)  CBG MONITORING, ED    EKG EKG Interpretation  Date/Time:  Saturday September 15 2018 11:15:49 EDT Ventricular Rate:  75 PR Interval:    QRS Duration: 87 QT Interval:  405 QTC Calculation: 453 R Axis:   91 Text Interpretation:  Normal sinus rhythm ST elevation consider inferior injury or acute infarct Confirmed by Pattricia Boss [id number]) on 09/15/2018 11:20:15 AM   Radiology Mr Brain Wo Contrast  Result Date: 09/15/2018 CLINICAL DATA:  Vertigo. Evaluate for posterior circulation infarct. EXAM: MRI HEAD WITHOUT CONTRAST TECHNIQUE: Multiplanar, multiecho pulse sequences of the brain and surrounding structures were obtained without intravenous contrast. COMPARISON:  CT head  07/03/2018. FINDINGS: Brain: No acute stroke, acute hemorrhage, mass lesion, hydrocephalus, or extra-axial fluid. Advanced for age cerebral atrophy with prominence of the ventricles, cisterns, and sulci. Pronounced perisylvian brain substance loss, appears to represent symmetric temporal lobe atrophy. Moderate subcortical and periventricular T2 and FLAIR hyperintensities, likely chronic microvascular ischemic change. Chronic microvascular ischemic changes disproportionately affect the brainstem, where there is T2 and FLAIR hyperintensity extending from the mid pons into the BILATERAL brachium pontis (demonstrating facilitated diffusion). Numerous areas of chronic cerebral infarction are identified including RIGHT paramedian pons, and LEFT thalamus/cerebral peduncle. Vascular: Normal flow voids. Small foci of chronic hemorrhage associated with the RIGHT pontine and LEFT thalamic lacunar infarcts. Skull and upper cervical spine: Normal marrow signal. Sinuses/Orbits: Negative. Other: None. When compared with prior CT, good general agreement. IMPRESSION: Age advanced atrophy with extensive small vessel disease, and chronic lacunar infarctions. The observed areas of chronic ischemia and chronic hemorrhage likely represent small vessel disease related to a combination of diabetes and hypertension in this patient. No acute stroke.  No large vessel occlusion. Electronically Signed   By: Staci Righter M.D.   On: 09/15/2018 13:40    Procedures Procedures (including critical care time)  Medications Ordered in ED Medications  diazepam (VALIUM) tablet 5 mg (5 mg Oral Given 09/15/18 1152)     Initial Impression / Assessment and Plan / ED Course  I have reviewed the triage vital signs and the nursing notes.  Pertinent labs & imaging results that were available during my care of the patient were reviewed by me and considered in my medical decision making (see chart for details).        Patient seen and examined.   Previous CT was abnormal.  Symptoms today are somewhat concerning for central vertigo.  Patient has significant risk factor profile.  Due to this, will evaluate further with MRI.  Vital signs reviewed and are as follows: BP 139/70 (BP Location: Right Arm)   Pulse 90   Temp 97.9 F (36.6 C) (Oral)   Resp 20   Ht 5' (1.524 m)   Wt 72.1 kg   SpO2 98%   BMI  31.05 kg/m   EKG reviewed.  There is minimal ST segment elevation noted.  Patient without chest pain or shortness of breath to suggest ACS.  2:52 PM Pt has done well here in ED. She feels better with Valium.   MRI completed. Reviewed results -- discussed with Dr. Jeanell Sparrow.   Discussed with Dr. Rory Percy by phone -- agrees no acute stroke and that she needs to manage her blood pressure and blood sugars.   Pt updated. She has been hypertensive here. Will increase her dose of lisinopril/HCTZ.  I strongly encouraged her to check her blood pressures.  If she feels lightheaded with new dose of medication, reduce to her oral dose.  She is to follow-up closely with her primary care doctor.  Neurology appointment scheduled for June.  Patient counseled to return if they have weakness in their arms or legs, slurred speech, trouble walking or talking, confusion, worsening trouble with their balance, or if they have any other concerns. Patient verbalizes understanding and agrees with plan.    Final Clinical Impressions(s) / ED Diagnoses   Final diagnoses:  Vertigo  Essential hypertension  Hyperglycemia   Patient with ongoing vertiginous symptoms, waxing and waning, with multiple chronic lacunar infarcts associate with her underlying diabetes and hypertension.  While these may be related to her symptoms, there are no acute strokes which would indicate admission to the hospital for further work-up and re-stratification.  Management will be directed at tight blood pressure and blood sugar control.  Patient improved with symptomatic treatment in the ED.  She  will be discharged home.  She has primary care follow-up and scheduled neuro follow-up.   ED Discharge Orders         Ordered    diazepam (VALIUM) 5 MG tablet  Every 12 hours PRN     09/15/18 1431    lisinopril-hydrochlorothiazide (PRINZIDE,ZESTORETIC) 20-25 MG tablet  Daily     09/15/18 1434           Carlisle Cater, PA-C 09/15/18 1457    Pattricia Boss, MD 09/15/18 [id number]

## 2018-09-15 NOTE — Discharge Instructions (Signed)
Please read and follow all provided instructions.  Your diagnoses today include:  1. Vertigo   2. Essential hypertension   3. Hyperglycemia     Tests performed today include:  MRI - does not show any acute strokes, shows signs of chronic high blood pressure and changes due to that  Blood counts and electrolytes - high blood sugar  Vital signs. See below for your results today.   Medications prescribed:   Valium - muscle relaxer medication  DO NOT drive or perform any activities that require you to be awake and alert because this medicine can make you drowsy.    Lisinopril/HCTZ -we will double your current blood pressure medication.  Check and record your blood pressures daily.  Take any prescribed medications only as directed.  Home care instructions:  Follow any educational materials contained in this packet.  BE VERY CAREFUL not to take multiple medicines containing Tylenol (also called acetaminophen). Doing so can lead to an overdose which can damage your liver and cause liver failure and possibly death.   Follow-up instructions: Please follow-up with your primary care provider in the next 7 days for further evaluation of your symptoms.   Return instructions:   Please return to the Emergency Department if you experience worsening symptoms.   Return if you have weakness in your arms or legs, slurred speech, trouble walking or talking, confusion, or worsening trouble with your balance.   Please return if you have any other emergent concerns.  Additional Information:  Your vital signs today were: BP (!) 156/134 (BP Location: Right Arm)    Pulse 88    Temp 97.9 F (36.6 C) (Oral)    Resp 16    Ht 5' (1.524 m)    Wt 72.1 kg    SpO2 99%    BMI 31.05 kg/m  If your blood pressure (BP) was elevated above 135/85 this visit, please have this repeated by your doctor within one month. --------------

## 2018-09-15 NOTE — ED Triage Notes (Signed)
Pt brought in by [facility] from home for dizziness x3 days. Pt states she had similar episode x2 years ago. Pt states she was seen x2 months ago and diagnosed with brain mass/lesion. States she was supposed to follow up with neurology but her appointment was cancelled due to the recent pandemic Pt A+Ox4, no neuro deficits noted per EMS.

## 2018-09-19 ENCOUNTER — Ambulatory Visit: Payer: Self-pay | Admitting: Neurology

## 2018-10-05 ENCOUNTER — Ambulatory Visit: Payer: Self-pay | Attending: Nurse Practitioner | Admitting: Nurse Practitioner

## 2018-10-05 ENCOUNTER — Encounter: Payer: Self-pay | Admitting: Nurse Practitioner

## 2018-10-05 ENCOUNTER — Other Ambulatory Visit: Payer: Self-pay

## 2018-10-05 DIAGNOSIS — E1165 Type 2 diabetes mellitus with hyperglycemia: Secondary | ICD-10-CM

## 2018-10-05 DIAGNOSIS — I10 Essential (primary) hypertension: Secondary | ICD-10-CM

## 2018-10-05 DIAGNOSIS — J309 Allergic rhinitis, unspecified: Secondary | ICD-10-CM

## 2018-10-05 MED ORDER — METFORMIN HCL 500 MG PO TABS: 1000.0000 mg | ORAL_TABLET | Freq: Two times a day (BID) | ORAL | 2 refills | Status: AC

## 2018-10-05 MED ORDER — INSULIN PEN NEEDLE 31G X 5 MM MISC: 1 refills | Status: AC

## 2018-10-05 MED ORDER — TRUEPLUS LANCETS 28G MISC: 1.0000 | Freq: Three times a day (TID) | 11 refills | Status: AC

## 2018-10-05 MED ORDER — LORATADINE 10 MG PO TABS: 10.0000 mg | ORAL_TABLET | Freq: Every day | ORAL | 0 refills | Status: DC

## 2018-10-05 MED ORDER — GLIPIZIDE 10 MG PO TABS: 10.0000 mg | ORAL_TABLET | Freq: Every day | ORAL | 3 refills | Status: AC

## 2018-10-05 MED ORDER — AMLODIPINE BESYLATE 5 MG PO TABS: 5.0000 mg | ORAL_TABLET | Freq: Every day | ORAL | 1 refills | Status: DC

## 2018-10-05 MED ORDER — TRUE METRIX METER DEVI: 1.0000 | Freq: Three times a day (TID) | 0 refills | Status: AC

## 2018-10-05 MED ORDER — FLUTICASONE PROPIONATE 50 MCG/ACT NA SUSP: 2.0000 | Freq: Every day | NASAL | 0 refills | Status: DC

## 2018-10-05 MED ORDER — LIRAGLUTIDE 18 MG/3ML ~~LOC~~ SOPN: PEN_INJECTOR | SUBCUTANEOUS | 0 refills | Status: AC

## 2018-10-05 MED ORDER — GLUCOSE BLOOD VI STRP: 1.0000 | ORAL_STRIP | Freq: Three times a day (TID) | 12 refills | Status: AC

## 2018-10-05 MED ORDER — LISINOPRIL-HYDROCHLOROTHIAZIDE 20-25 MG PO TABS: 1.0000 | ORAL_TABLET | Freq: Every day | ORAL | 1 refills | Status: AC

## 2018-10-05 MED FILL — FLUTICASONE PROP 50 MCG SPR: 50 | 30 days supply | Qty: 16 | Fill #0

## 2018-10-05 NOTE — Progress Notes (Signed)
Virtual Visit via Telephone Note  I connected with Cassandra Gordon on 10/05/18 at  3:30 PM EDT by telephone and verified that I am speaking with the correct person using two identifiers.   I discussed the limitations, risks, security and privacy concerns of performing an evaluation and management service by telephone and the availability of in person appointments. I also discussed with the patient that there may be a patient responsible charge related to this service. The patient expressed understanding and agreed to proceed.   History of Present Illness: She is establishing care today. Has a history of poorly controlled DM Type 2, HTN, HPL and tobacco dependence. She has been seen in the ED for vertigo and was prescribed loratadine and flonase several weeks ago. She endorses persistent vertigo however she has not picked up her flonase or loratadine. I have instructed her to pick up her allergy meds to see if this will help relieve her allergy symptoms which may be contributing to her vertigo. She also needs tight BP and DM control. Dizziness described as a spinning sensation.  She is able to walk but feels very unsteady on her feet like she is going to fall.  She has had some blurry vision but no loss of vision or significant double vision. She has an occasional headache.  No nausea or vomiting. Aggravating factors:  movement and walking.  No chest pain, nausea, vomiting, shortness of breath, abdominal pain.  No GU symptoms.  No previous history of strokes.   MRI BRAIN 08-2018 Multiple chronic lacunar infarcts associate with her underlying diabetes and hypertension.   She has an appointment scheduled with Neurology in June.    DM type 2 Lowest a1c "was around 7 years ago". She is only taking metformin 1000 mg BID and glipizide 10mg  daily. I have instructed her that in order to lower her A1c we will need to add an additional agent (victoza) and possibly insulin if the additional agent (victoza) is not  effective. She has refused insulin and injectables in the past. I have instructed her that her diabetes if poorly controlled can cause death if it is not treated appropriately. We also discussed dietary and exercise modifications as well as tobacco cessation today. Will add victoza 1.8 mg today.  Lab Results  Component Value Date   HGBA1C 11.5 (A) 07/19/2018   Tobacco Dependence Smoking cigars up to  3-4 black and milds daily. She is not ready to quit.    Essential Hypertension BP at goal. She does not monitor her blood pressure at home. Endorses medication compliance taking lisinopril-hctz 20-25 twice a day. I have decreased lisinopril-hctz combo to 20-25 once daily and added amlodipine 5mg  daily. She denies chest pain, shortness of breath, palpitations, lightheadedness, dizziness, headaches or BLE edema.  BP Readings from Last 3 Encounters:  09/15/18 135/71  09/07/18 (!) 169/87  07/19/18 (!) 175/90    Observations/Objective: Aware, alert and oriented x3.   Assessment and Plan:   Follow Up Instructions: Cassandra StanleyLisa was seen today for new patient (initial visit).  Diagnoses and all orders for this visit:  Essential hypertension -     lisinopril-hydrochlorothiazide (ZESTORETIC) 20-25 MG tablet; Take 1 tablet by mouth daily for 30 days. -     amLODipine (NORVASC) 5 MG tablet; Take 1 tablet (5 mg total) by mouth daily for 30 days. Continue all antihypertensives as prescribed.  Remember to bring in your blood pressure log with you for your follow up appointment.  DASH/Mediterranean Diets are healthier choices  for HTN.    Type 2 diabetes mellitus with hyperglycemia, unspecified whether long term insulin use (HCC) -     metFORMIN (GLUCOPHAGE) 500 MG tablet; Take 2 tablets (1,000 mg total) by mouth 2 (two) times daily with a meal. -     glipiZIDE (GLUCOTROL) 10 MG tablet; Take 1 tablet (10 mg total) by mouth daily before breakfast. -     TRUEplus Lancets 28G MISC; 1 each by Does not apply  route 3 (three) times daily. -     glucose blood (TRUE METRIX BLOOD GLUCOSE TEST) test strip; 1 each by Other route 3 (three) times daily. -     Blood Glucose Monitoring Suppl (TRUE METRIX METER) DEVI; 1 each by Does not apply route 3 (three) times daily. -     liraglutide (VICTOZA) 18 MG/3ML SOPN; SubQ:  Inject 0.6 mg once daily into the skin. Week 2: increase to 1.2 mg once daily; week 3: increase to 1.8 mg once daily -     Insulin Pen Needle (B-D UF III MINI PEN NEEDLES) 31G X 5 MM MISC; Use as instructed. Inject into the skin once daily Diabetes is poorly controlled. Advised patient to keep a fasting blood sugar log fast, 2 hours post lunch and bedtime which will be reviewed at the next office visit.  Allergic rhinitis, unspecified seasonality, unspecified trigger -     loratadine (CLARITIN) 10 MG tablet; Take 1 tablet (10 mg total) by mouth daily for 30 days. -     fluticasone (FLONASE) 50 MCG/ACT nasal spray; Place 2 sprays into both nostrils daily.     I discussed the assessment and treatment plan with the patient. The patient was provided an opportunity to ask questions and all were answered. The patient agreed with the plan and demonstrated an understanding of the instructions.   The patient was advised to call back or seek an in-person evaluation if the symptoms worsen or if the condition fails to improve as anticipated.  I provided 20 minutes of non-face-to-face time during this encounter.   Claiborne Rigg, NP

## 2018-10-07 ENCOUNTER — Encounter: Payer: Self-pay | Admitting: Nurse Practitioner

## 2018-10-08 ENCOUNTER — Other Ambulatory Visit: Payer: Self-pay | Admitting: Nurse Practitioner

## 2018-10-08 ENCOUNTER — Telehealth: Payer: Self-pay | Admitting: General Practice

## 2018-10-08 DIAGNOSIS — I10 Essential (primary) hypertension: Secondary | ICD-10-CM

## 2018-10-08 NOTE — Telephone Encounter (Signed)
Patient returned PCP call and stated that any medication that has to do with her allergies she wants it sent to [facility] on [address]. Everything else she wants it mailed.  Please f/u

## 2018-10-08 NOTE — Progress Notes (Signed)
LVM for patient to verify whether she wanted medications sent through the mail vs being sent to [facility]. I also instructed her there would be a charge for the meter and supplies if being sent to [facility].

## 2018-10-10 ENCOUNTER — Other Ambulatory Visit: Payer: Self-pay | Admitting: Nurse Practitioner

## 2018-10-10 DIAGNOSIS — J309 Allergic rhinitis, unspecified: Secondary | ICD-10-CM

## 2018-10-10 MED ORDER — FLUTICASONE PROPIONATE 50 MCG/ACT NA SUSP: 2.0000 | Freq: Every day | NASAL | 0 refills | Status: AC

## 2018-10-10 MED ORDER — LORATADINE 10 MG PO TABS: 10.0000 mg | ORAL_TABLET | Freq: Every day | ORAL | 0 refills | Status: AC

## 2018-10-13 MED FILL — VICTOZA 18 MG/3 ML INJECT P: 18 | 17 days supply | Qty: 3 | Fill #0

## 2018-10-13 MED FILL — TRUE METRIX TEST STRIP: 30 days supply | Qty: 100 | Fill #0

## 2018-10-13 MED FILL — TRUEplus LANCETS 28G MISC: 30 days supply | Qty: 100 | Fill #0

## 2018-10-13 MED FILL — LISINOPRIL-HCTZ 20-25 MG TA: 20-25 | 30 days supply | Qty: 30 | Fill #0

## 2018-10-13 MED FILL — ?AMLODIPINE BESYLATE 5MG TA: 5 | 30 days supply | Qty: 30 | Fill #0

## 2018-10-13 MED FILL — !TRUE METRIX BLOOD GLUCOSE: 1 days supply | Qty: 1 | Fill #0

## 2018-10-13 MED FILL — ?GLIPIZIDE 10 MG TABLET: 10 | 30 days supply | Qty: 30 | Fill #0

## 2018-10-13 MED FILL — ?METFORMIN HCL 500MG TABLET: 500 | 30 days supply | Qty: 120 | Fill #0

## 2018-10-23 ENCOUNTER — Ambulatory Visit: Payer: Self-pay | Attending: Nurse Practitioner | Admitting: Nurse Practitioner

## 2018-10-23 ENCOUNTER — Other Ambulatory Visit: Payer: Self-pay

## 2018-10-23 ENCOUNTER — Encounter: Payer: Self-pay | Admitting: Nurse Practitioner

## 2018-10-23 DIAGNOSIS — IMO0002 Reserved for concepts with insufficient information to code with codable children: Secondary | ICD-10-CM

## 2018-10-23 DIAGNOSIS — E1165 Type 2 diabetes mellitus with hyperglycemia: Secondary | ICD-10-CM

## 2018-10-23 DIAGNOSIS — E1169 Type 2 diabetes mellitus with other specified complication: Secondary | ICD-10-CM

## 2018-10-23 DIAGNOSIS — E118 Type 2 diabetes mellitus with unspecified complications: Secondary | ICD-10-CM

## 2018-10-23 DIAGNOSIS — R2689 Other abnormalities of gait and mobility: Secondary | ICD-10-CM

## 2018-10-23 DIAGNOSIS — R42 Dizziness and giddiness: Secondary | ICD-10-CM

## 2018-10-23 DIAGNOSIS — E871 Hypo-osmolality and hyponatremia: Secondary | ICD-10-CM

## 2018-10-23 MED ORDER — MECLIZINE HCL 25 MG PO TABS: 25.0000 mg | ORAL_TABLET | Freq: Three times a day (TID) | ORAL | 1 refills | Status: AC | PRN

## 2018-10-23 MED ORDER — LORATADINE-PSEUDOEPHEDRINE ER 10-240 MG PO TB24: 1.0000 | ORAL_TABLET | Freq: Every day | ORAL | 0 refills | Status: AC

## 2018-10-23 MED ORDER — LORATADINE-PSEUDOEPHEDRINE ER 10-240 MG PO TB24: 1.0000 | ORAL_TABLET | Freq: Every day | ORAL | 0 refills | Status: DC

## 2018-10-23 MED ORDER — MECLIZINE HCL 25 MG PO TABS: 25.0000 mg | ORAL_TABLET | Freq: Three times a day (TID) | ORAL | 1 refills | Status: DC | PRN

## 2018-10-23 MED FILL — MECLIZINE 25 MG TABLET: 25 | 10 days supply | Qty: 30 | Fill #0

## 2018-10-23 NOTE — Progress Notes (Signed)
Virtual Visit via Telephone Note Due to national recommendations of social distancing due to COVID 19, telehealth visit is felt to be most appropriate for this patient at this time.  I discussed the limitations, risks, security and privacy concerns of performing an evaluation and management service by telephone and the availability of in person appointments. I also discussed with the patient that there may be a patient responsible charge related to this service. The patient expressed understanding and agreed to proceed.    I connected with Cassandra Gordon on 10/23/18  at  9:50 AM EDT  EDT by telephone and verified that I am speaking with the correct person using two identifiers.    Consent I discussed the limitations, risks, security and privacy concerns of performing an evaluation and management service by telephone and the availability of in person appointments. I also discussed with the patient that there may be a patient responsible charge related to this service. The patient expressed understanding and agreed to proceed.   Location of Patient: Private Residence   Location of Provider: [facility]    Persons participating in Telemedicine visit: Cassandra Denver FNP-BC [place] CMA Cassandra Gordon    History of Present Illness: Telemedicine visit for: Follow up to nausea, vertigo and ataxia.    Vertigo She was seen in the ED on 09-15-2018 with complaints of blurred vision, vertigo and balance problems.  There was concern for central vertigo and MRI was obtained. Based on MRI results acute stroke was ruled out and patient was instructed to tightly manage and monitor her blood pressure and blood sugars. She has a neurology appointment scheduled for June.  MRI 09-15-2018 IMPRESSION: Age advanced atrophy with extensive small vessel disease, and chronic lacunar infarctions. The observed areas of chronic ischemia and chronic hemorrhage likely represent small  vessel disease related to a combination of diabetes and hypertension in this patient. No acute stroke.  No large vessel occlusion. CT 07-03-2018 CT 07/03/18: IMPRESSION: 1. Advanced and age indeterminate multifocal small vessel ischemia in the brain. Consider an acute small vessel infarct in this clinical setting. 2. No CT evidence of acute cortically based infarct. No acute intracranial hemorrhage. 3. Nonspecific perisylvian volume loss with mild ventriculomegaly.   Today she has persistent complaints of vertigo and balance disturbance. Denies any falls but states she has to hold on to objects at times to keep from falling. Denies any syncopal episodes. She has a blood pressure monitor but has not been monitoring her blood pressure when symptoms occur. Vertigo is described as spinning. Other symptoms include headache. She denies chest pain, shortness of breath, vomiting or loss of vision. I have instructed her to monitor her blood pressure daily as she was also recently prescribed amlodipine 5mg  in addition to her current regimen of lisinopril-hctz 20-25 mg daily.    DM TYPE 2 Chronic and poorly controlled. She does not consistently monitor her blood glucose levels at home. Endorses medication compliance taking victoza 1.8mg  daily, metformin 1000 mg BID and glipizide 10mg  daily.  Lab Results  Component Value Date   HGBA1C 11.5 (A) 07/19/2018     Past Medical History:  Diagnosis Date  . Diabetes mellitus without complication (HCC)   . Hypertension     History reviewed. No pertinent surgical history.  Family History  Problem Relation Age of Onset  . Cancer Other   . Asthma Other   . Anxiety disorder Sister   . Diabetes Paternal Grandmother     Social  History   Socioeconomic History  . Marital status: Single    Spouse name: Not on file  . Number of children: Not on file  . Years of education: Not on file  . Highest education level: Not on file  Occupational History  . Not  on file  Social Needs  . Financial resource strain: Not on file  . Food insecurity:    Worry: Not on file    Inability: Not on file  . Transportation needs:    Medical: Not on file    Non-medical: Not on file  Tobacco Use  . Smoking status: Current Every Day Smoker  . Smokeless tobacco: Never Used  Substance and Sexual Activity  . Alcohol use: Not Currently  . Drug use: Never  . Sexual activity: Not Currently  Lifestyle  . Physical activity:    Days per week: Not on file    Minutes per session: Not on file  . Stress: Not on file  Relationships  . Social connections:    Talks on phone: Not on file    Gets together: Not on file    Attends religious service: Not on file    Active member of club or organization: Not on file    Attends meetings of clubs or organizations: Not on file    Relationship status: Not on file  Other Topics Concern  . Not on file  Social History Narrative  . Not on file     Observations/Objective: Awake, alert and oriented x 3   Review of Systems  Constitutional: Negative for fever, malaise/fatigue and weight loss.  HENT: Negative.  Negative for nosebleeds.   Eyes: Negative.  Negative for blurred vision, double vision and photophobia.  Respiratory: Negative.  Negative for cough and shortness of breath.   Cardiovascular: Negative.  Negative for chest pain, palpitations and leg swelling.  Gastrointestinal: Negative.  Negative for heartburn, nausea and vomiting.  Musculoskeletal: Negative.  Negative for myalgias.  Neurological: Positive for dizziness. Negative for focal weakness, seizures and headaches.  Endo/Heme/Allergies: Positive for environmental allergies.  Psychiatric/Behavioral: Negative.  Negative for suicidal ideas.    Assessment and Plan: Jaeleah was seen today for follow-up.  Diagnoses and all orders for this visit:  Balance disorder -   She has an appointment with Dr. Everlena Cooper in June   Dizziness She has an appointment with Dr. Everlena Cooper  in June -     meclizine (ANTIVERT) 25 MG tablet; Take 1 tablet (25 mg total) by mouth 3 (three) times daily as needed for dizziness. -     loratadine-pseudoephedrine (CLARITIN-D 24 HOUR) 10-240 MG 24 hr tablet; Take 1 tablet by mouth daily for 14 days. -     TSH  Hyponatremia -     Basic Metabolic Panel  Diabetes mellitus type 2, uncontrolled, with complications (HCC) -     Lipid panel -     Hemoglobin A1c ,Diabetes is poorly controlled. Advised patient to keep a fasting blood sugar log fast, 2 hours post lunch and bedtime which will be reviewed at the next office visit.    Follow Up Instructions Return in about 4 weeks (around 11/20/2018).     I discussed the assessment and treatment plan with the patient. The patient was provided an opportunity to ask questions and all were answered. The patient agreed with the plan and demonstrated an understanding of the instructions.   The patient was advised to call Gordon or seek an in-person evaluation if the symptoms worsen or if the  condition fails to improve as anticipated.  I provided 28 minutes of non-face-to-face time during this encounter including median intraservice time, reviewing previous notes, labs, imaging, medications and explaining diagnosis and management.  Claiborne RiggZelda W Montez Stryker, FNP-BC

## 2018-10-24 ENCOUNTER — Ambulatory Visit: Payer: Self-pay | Attending: Nurse Practitioner

## 2018-10-25 LAB — HEMOGLOBIN A1C
Est. average glucose Bld gHb Est-mCnc: 189 mg/dL
Hgb A1c MFr Bld: 8.2 % — ABNORMAL HIGH (ref 4.8–5.6)

## 2018-10-25 LAB — LIPID PANEL
Chol/HDL Ratio: 4 ratio (ref 0.0–4.4)
Cholesterol, Total: 191 mg/dL (ref 100–199)
HDL: 48 mg/dL (ref >39)
LDL Calculated: 115 mg/dL — ABNORMAL HIGH (ref 0–99)
Triglycerides: 141 mg/dL (ref 0–149)
VLDL Cholesterol Cal: 28 mg/dL (ref 5–40)

## 2018-10-25 LAB — BASIC METABOLIC PANEL
BUN/Creatinine Ratio: 27 — ABNORMAL HIGH (ref 9–23)
BUN: 22 mg/dL (ref 6–24)
CO2: 25 mmol/L (ref 20–29)
Calcium: 10.5 mg/dL — ABNORMAL HIGH (ref 8.7–10.2)
Chloride: 99 mmol/L (ref 96–106)
Creatinine, Ser: 0.82 mg/dL (ref 0.57–1.00)
GFR calc Af Amer: 95 mL/min/{1.73_m2} (ref >59)
GFR calc non Af Amer: 83 mL/min/{1.73_m2} (ref >59)
Glucose: 90 mg/dL (ref 65–99)
Potassium: 4.5 mmol/L (ref 3.5–5.2)
Sodium: 139 mmol/L (ref 134–144)

## 2018-10-25 LAB — TSH: TSH: 1.24 u[IU]/mL (ref 0.450–4.500)

## 2018-11-05 ENCOUNTER — Telehealth: Payer: Self-pay

## 2018-11-05 NOTE — Telephone Encounter (Signed)
CMA attempt to reach patient to inform on lab results.  No answer and left a VM.  

## 2018-11-05 NOTE — Telephone Encounter (Signed)
-----   Message from Claiborne Rigg, NP sent at 10/31/2018  9:24 PM EDT ----- Thyroid level is normal as well as kidney and liver function. Cholesterol levels are essentially normal however your LDL cholesterol is elevated. INSTRUCTIONS: Work on a low fat, heart healthy diet and participate in regular aerobic exercise program by working out at least 150 minutes per week; 5 days a week-30 minutes per day. Avoid red meat, fried foods. junk foods, sodas, sugary drinks, unhealthy snacking, alcohol and smoking.  Drink at least 48oz of water per day and monitor your fat intake

## 2018-11-20 ENCOUNTER — Encounter: Payer: Self-pay | Admitting: Neurology

## 2018-11-22 ENCOUNTER — Telehealth: Payer: Self-pay | Admitting: Neurology

## 2018-11-22 NOTE — Telephone Encounter (Signed)
Virtual visit is okay.

## 2018-11-22 NOTE — Telephone Encounter (Signed)
Pt is scheduled for an in-person visit on 6/9 but is wondering if she could have a virtual visit instead because she will not be in the area on that day. Pls call pt.

## 2018-11-23 NOTE — Telephone Encounter (Signed)
Pt scheduled for virtual visit on 6/10 at 7:50am.

## 2018-11-26 ENCOUNTER — Ambulatory Visit: Payer: Self-pay | Admitting: Neurology

## 2018-11-27 ENCOUNTER — Encounter: Payer: Self-pay | Admitting: Neurology

## 2018-11-27 ENCOUNTER — Ambulatory Visit: Payer: Self-pay | Admitting: Neurology

## 2018-11-27 NOTE — Progress Notes (Signed)
No show

## 2018-11-28 ENCOUNTER — Other Ambulatory Visit: Payer: Self-pay

## 2018-11-28 ENCOUNTER — Telehealth (INDEPENDENT_AMBULATORY_CARE_PROVIDER_SITE_OTHER): Payer: Self-pay | Admitting: Neurology

## 2018-11-28 ENCOUNTER — Encounter: Payer: Self-pay | Admitting: Neurology

## 2018-12-31 NOTE — Progress Notes (Deleted)
Virtual Visit via Video Note The purpose of this virtual visit is to provide medical care while limiting exposure to the novel coronavirus.    Consent was obtained for video visit:  Yes Answered questions that patient had about telehealth interaction:  Yes I discussed the limitations, risks, security and privacy concerns of performing an evaluation and management service by telemedicine. I also discussed with the patient that there may be a patient responsible charge related to this service. The patient expressed understanding and agreed to proceed.  Pt location: Home Physician Location: Home Name of referring provider:  Claiborne RiggFleming, Cassandra Gordon I connected with Cassandra Gordon at patients initiation/request on 01/01/2019 at 12:50 PM EDT by video enabled telemedicine application and verified that I am speaking with the correct person using two identifiers. Pt MRN:  [id number] Pt DOB:  1966-06-08 Video Participants:  Cassandra Gordon   History of Present Illness:  Cassandra Gordon is a 53 year old woman with hypertension and type 2 diabetes mellitus who presents for balance disorder.  History supplemented by ED and referring provider notes.  ***.  She had a CT of head performed on 07/03/18, which was personally reviewed and demonstrated age-indeterminate small vessel ischemia in the left thalamus and brainstem as well as the bilateral corona radiata and nonspecific perisylvian volume loss with mild ventriculomegaly.  She later had an MRI of the brain without contrast on 09/15/18 which was personally reviewed and again demonstrated advanced atrophy and extensive chronic small vessel ischemic changes with chronic lacunar infarcts in the right pons and left thalamus/cerebral peduncle.  ***  10/24/18 LABS:  Hgb A1c 8.2 (from 11.5 five months prior); TSH 1.240; Lipid panel with t chol 191, TG 141, HDL 48 and LDL 115.  Past Medical History: Past Medical History:  Diagnosis Date  . Diabetes mellitus without  complication (HCC)   . Hypertension     Medications: Outpatient Encounter Medications as of 01/01/2019  Medication Sig  . Blood Glucose Monitoring Suppl (TRUE METRIX METER) DEVI 1 each by Does not apply route 3 (three) times daily.  . fluticasone (FLONASE) 50 MCG/ACT nasal spray Place 2 sprays into both nostrils daily.  Marland Kitchen. glipiZIDE (GLUCOTROL) 10 MG tablet Take 1 tablet (10 mg total) by mouth daily before breakfast.  . glucose blood (TRUE METRIX BLOOD GLUCOSE TEST) test strip 1 each by Other route 3 (three) times daily.  . Insulin Pen Needle (B-D UF III MINI PEN NEEDLES) 31G X 5 MM MISC Use as instructed. Inject into the skin once daily  . liraglutide (VICTOZA) 18 MG/3ML SOPN SubQ:  Inject 0.6 mg once daily into the skin. Week 2: increase to 1.2 mg once daily; week 3: increase to 1.8 mg once daily  . lisinopril-hydrochlorothiazide (ZESTORETIC) 20-25 MG tablet Take 1 tablet by mouth daily for 30 days.  Marland Kitchen. loratadine (CLARITIN) 10 MG tablet Take 1 tablet (10 mg total) by mouth daily for 30 days.  . meclizine (ANTIVERT) 25 MG tablet Take 1 tablet (25 mg total) by mouth 3 (three) times daily as needed for dizziness.  . metFORMIN (GLUCOPHAGE) 500 MG tablet Take 2 tablets (1,000 mg total) by mouth 2 (two) times daily with a meal.  . TRUEplus Lancets 28G MISC 1 each by Does not apply route 3 (three) times daily.   No facility-administered encounter medications on file as of 01/01/2019.     Allergies: No Known Allergies  Family History: Family History  Problem Relation Age of Onset  . Cancer Other   .  Asthma Other   . Anxiety disorder Sister   . Diabetes Paternal Grandmother     Social History: Social History   Socioeconomic History  . Marital status: Single    Spouse name: Not on file  . Number of children: Not on file  . Years of education: Not on file  . Highest education level: Not on file  Occupational History  . Not on file  Social Needs  . Financial resource strain: Not on  file  . Food insecurity    Worry: Not on file    Inability: Not on file  . Transportation needs    Medical: Not on file    Non-medical: Not on file  Tobacco Use  . Smoking status: Current Every Day Smoker  . Smokeless tobacco: Never Used  Substance and Sexual Activity  . Alcohol use: Not Currently  . Drug use: Never  . Sexual activity: Not Currently  Lifestyle  . Physical activity    Days per week: Not on file    Minutes per session: Not on file  . Stress: Not on file  Relationships  . Social Herbalist on phone: Not on file    Gets together: Not on file    Attends religious service: Not on file    Active member of club or organization: Not on file    Attends meetings of clubs or organizations: Not on file    Relationship status: Not on file  . Intimate partner violence    Fear of current or ex partner: Not on file    Emotionally abused: Not on file    Physically abused: Not on file    Forced sexual activity: Not on file  Other Topics Concern  . Not on file  Social History Narrative   Right handed   Lives in a single story home alone   Unemployed    Observations/Objective:   *** No acute distress.  Alert and oriented.  Speech fluent and not dysarthric.  Language intact.  Eyes orthophoric on primary gaze.  Face symmetric.  Assessment and Plan:   ***  Follow Up Instructions:    -I discussed the assessment and treatment plan with the patient. The patient was provided an opportunity to ask questions and all were answered. The patient agreed with the plan and demonstrated an understanding of the instructions.   The patient was advised to call back or seek an in-person evaluation if the symptoms worsen or if the condition fails to improve as anticipated.    Total Time spent in visit with the patient was:  ***, of which more than 50% of the time was spent in counseling and/or coordinating care on ***.   Pt understands and agrees with the plan of care  outlined.     Dudley Major, DO

## 2019-01-01 ENCOUNTER — Other Ambulatory Visit: Payer: Self-pay

## 2019-01-01 ENCOUNTER — Telehealth: Payer: Self-pay | Admitting: Neurology

## 2019-01-01 ENCOUNTER — Telehealth: Payer: Self-pay

## 2019-01-01 NOTE — Telephone Encounter (Signed)
Attempted to reach Pt x3 by telephone to verify information for her virtual visit today, no answer, went straight to VM.  I left one message for a return call, and sent an invite by text on [email or web address].

## 2019-08-22 IMAGING — MR MRI HEAD WITHOUT CONTRAST
12 of 13 series · 44 of 48 positions shown · non-contrast
Comparison: CT head 07/03/2018.

CLINICAL DATA: Vertigo. Evaluate for posterior circulation infarct.

EXAM:
MRI HEAD WITHOUT CONTRAST
TECHNIQUE: Multiplanar, multiecho pulse sequences of the brain and surrounding
structures were obtained without intravenous contrast.

[Series 5: DWI · axial · 3.0mm · 0.92mm/px · z∈[-117,+21]mm · 7 of 102 slices shown (1 of 4)]
[im 1/102]
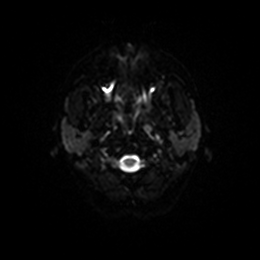
[im 17/102]
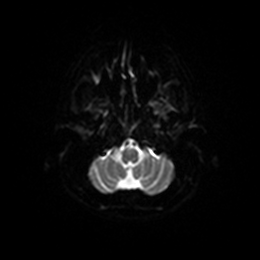
[im 34/102]
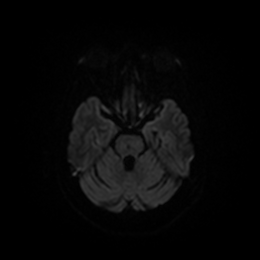
[im 51/102]
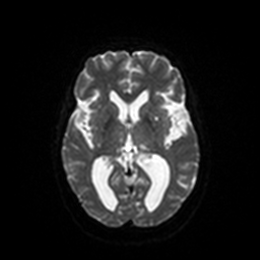
[im 68/102]
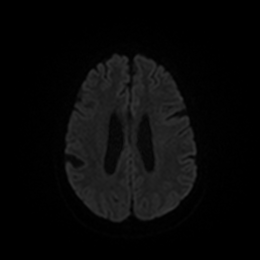
[im 85/102]
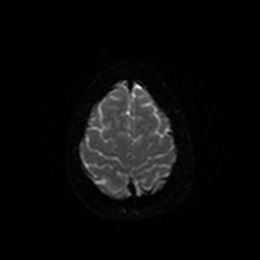
[im 102/102]
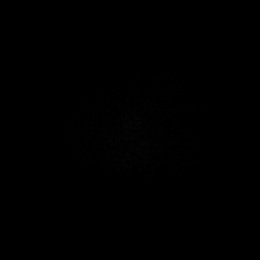

[Series 6: DWI · axial · 3.0mm · 0.92mm/px · z∈[-117,+21]mm · 4 of 50 slices shown (2 of 4)]
[im 1/50]
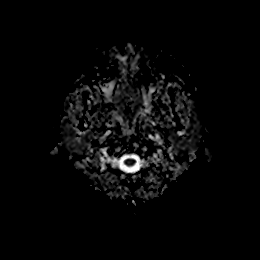
[im 17/50]
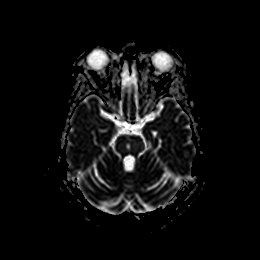
[im 33/50]
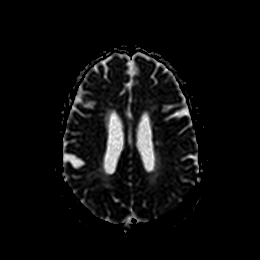
[im 50/50]
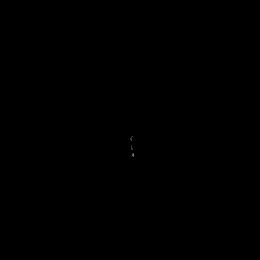

[Series 7: DWI · coronal · 4.0mm · 0.88mm/px · 6 of 74 slices shown (3 of 4)]
[im 1/74]
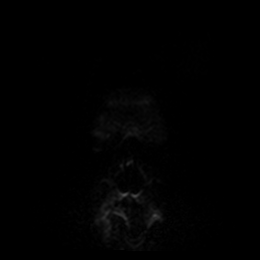
[im 15/74]
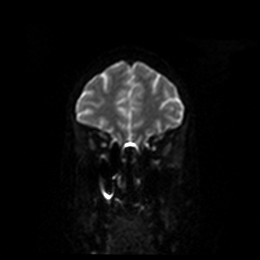
[im 30/74]
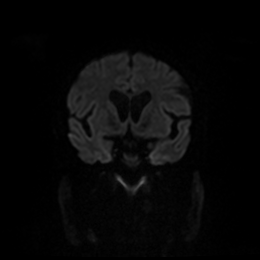
[im 44/74]
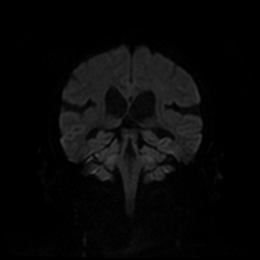
[im 59/74]
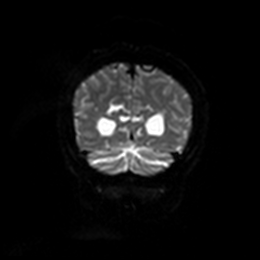
[im 74/74]
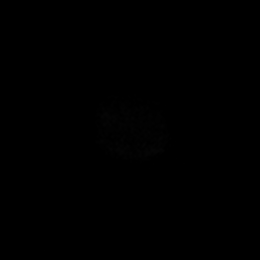

[Series 8: DWI · coronal · 4.0mm · 0.88mm/px · 3 of 37 slices shown (4 of 4)]
[im 1/37]
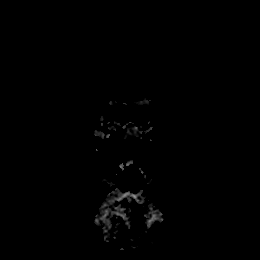
[im 19/37]
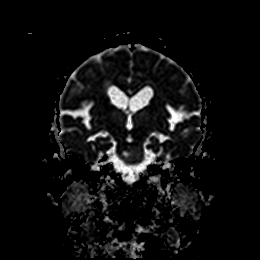
[im 37/37]
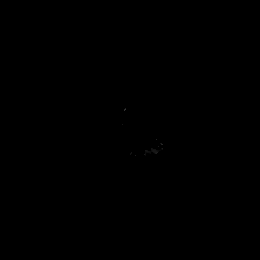

[Series 9: FLAIR · axial · 5.0mm · 0.45mm/px · z∈[-109,+23]mm · 2 of 25 slices shown]
[im 1/25]
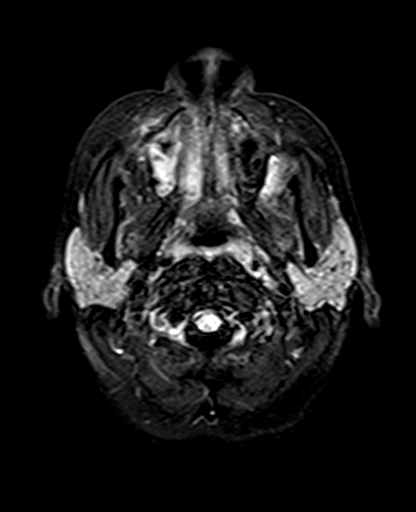
[im 25/25]
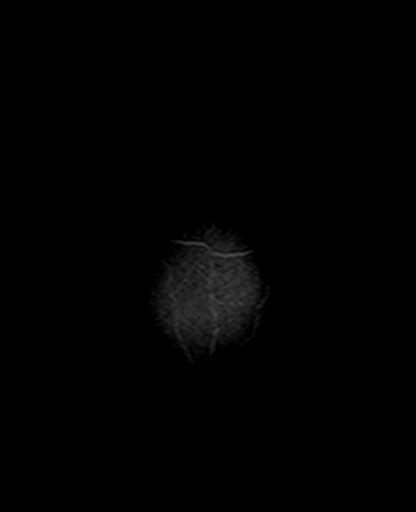

[Series 10: mag_images · axial · 3.0mm · 0.90mm/px · z∈[-119,+33]mm · 4 of 56 slices shown]
[im 1/56]
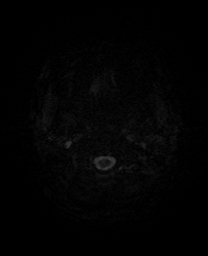
[im 19/56]
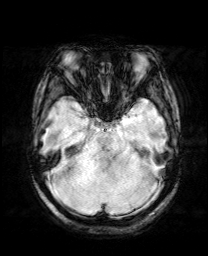
[im 37/56]
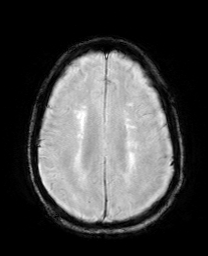
[im 56/56]
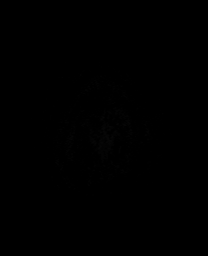

[Series 11: pha_images · axial · 3.0mm · 0.90mm/px · z∈[-119,+25]mm · 4 of 53 slices shown]
[im 1/53]
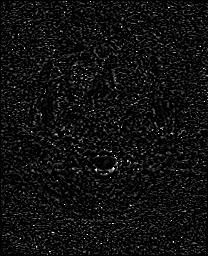
[im 18/53]
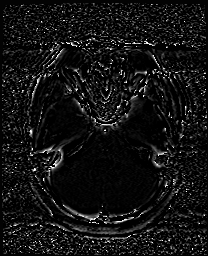
[im 35/53]
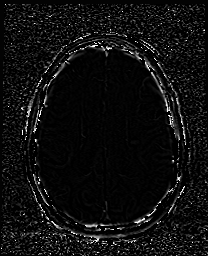
[im 53/53]
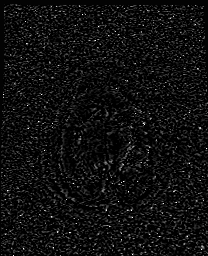

[Series 12: swi_images · axial · 3.0mm · 0.90mm/px · z∈[-119,+33]mm · 4 of 56 slices shown]
[im 1/56]
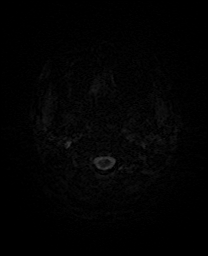
[im 19/56]
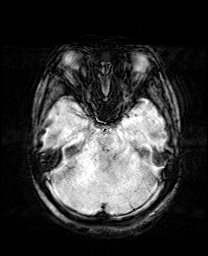
[im 37/56]
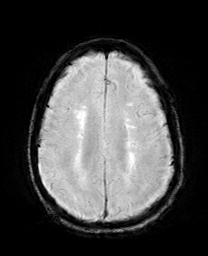
[im 56/56]
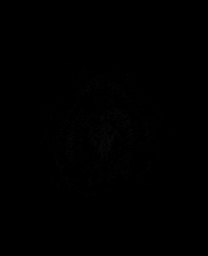

[Series 13: mip_images(sw) · axial · 24.0mm · 0.90mm/px · z∈[-109,+23]mm · 4 of 49 slices shown]
[im 1/49]
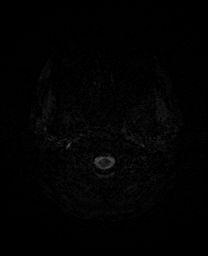
[im 17/49]
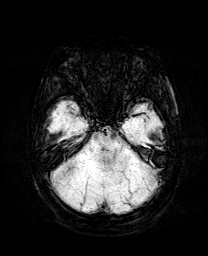
[im 33/49]
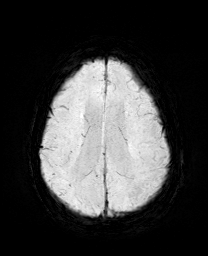
[im 49/49]
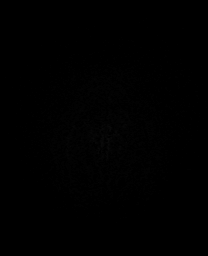

[Series 14: T1 · sagittal · 5.0mm · 0.75mm/px · 2 of 25 slices shown]
[im 1/25]
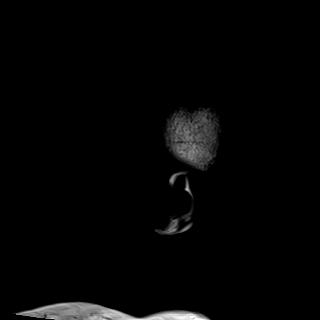
[im 25/25]
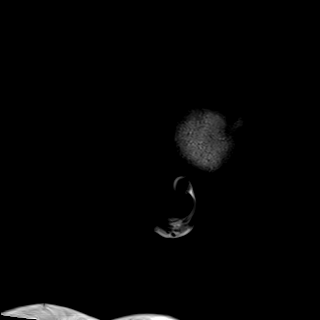

[Series 16: T2 · axial · 5.0mm · 0.72mm/px · z∈[-112,+21]mm · 2 of 25 slices shown (1 of 2)]
[im 1/25]
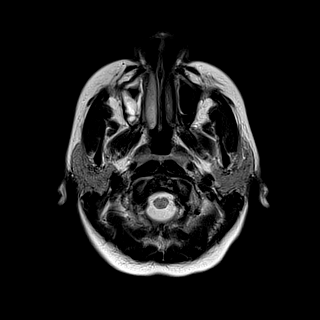
[im 25/25]
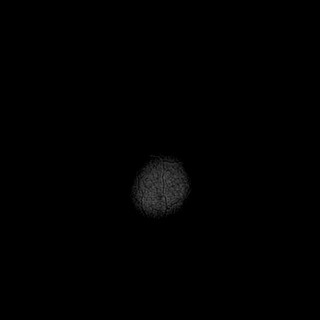

[Series 17: T2 · coronal · 5.0mm · 0.72mm/px · 2 of 31 slices shown (2 of 2)]
[im 1/31]
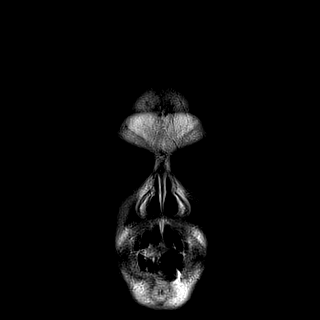
[im 31/31]
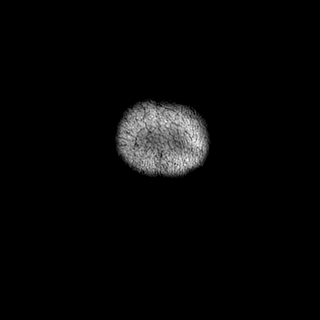

[44 of 48 positions shown; findings below may reference images not displayed]

FINDINGS: Brain: No acute stroke, acute hemorrhage, mass lesion,
hydrocephalus, or extra-axial fluid. Advanced for age cerebral
atrophy with prominence of the ventricles, cisterns, and sulci.
Pronounced perisylvian brain substance loss, appears to represent
symmetric temporal lobe atrophy.

Moderate subcortical and periventricular T2 and FLAIR
hyperintensities, likely chronic microvascular ischemic change.
Chronic microvascular ischemic changes disproportionately affect the
brainstem, where there is T2 and FLAIR hyperintensity extending from
the mid pons into the BILATERAL brachium pontis (demonstrating
facilitated diffusion).

Numerous areas of chronic cerebral infarction are identified
including RIGHT paramedian pons, and LEFT thalamus/cerebral
peduncle.

Vascular: Normal flow voids. Small foci of chronic hemorrhage
associated with the RIGHT pontine and LEFT thalamic lacunar
infarcts.

Skull and upper cervical spine: Normal marrow signal.

Sinuses/Orbits: Negative.

Other: None.

When compared with prior CT, good general agreement.
IMPRESSION: Age advanced atrophy with extensive small vessel disease, and
chronic lacunar infarctions. The observed areas of chronic ischemia
and chronic hemorrhage likely represent small vessel disease related
to a combination of diabetes and hypertension in this patient.

No acute stroke.  No large vessel occlusion.
